# Patient Record
Sex: Male | Born: 1985 | Race: White | Hispanic: No | Marital: Married | Smoking: Never smoker
Health system: Southern US, Community
[De-identification: ages and names within clinical notes are randomized; demographics above are authoritative.]

## PROBLEM LIST (undated history)

## (undated) DIAGNOSIS — E291 Testicular hypofunction: Secondary | ICD-10-CM

## (undated) HISTORY — PX: TONSILLECTOMY: SUR1361

## (undated) HISTORY — DX: Testicular hypofunction: E29.1

---

## 2007-04-18 ENCOUNTER — Ambulatory Visit: Payer: Self-pay | Admitting: Internal Medicine

## 2009-09-15 IMAGING — CT CT NECK WITH CONTRAST
2 series · 10 of 14 positions shown, 12 images · non-contrast
Comparison: none

REASON FOR EXAM: right neck mass
COMMENTS:

[Series 2: soft tissue · axial · 0.53mm/px · z∈[+531,+765]mm · 8 of 102 slices shown, 10 images]
[im 12/102  soft-tissue]
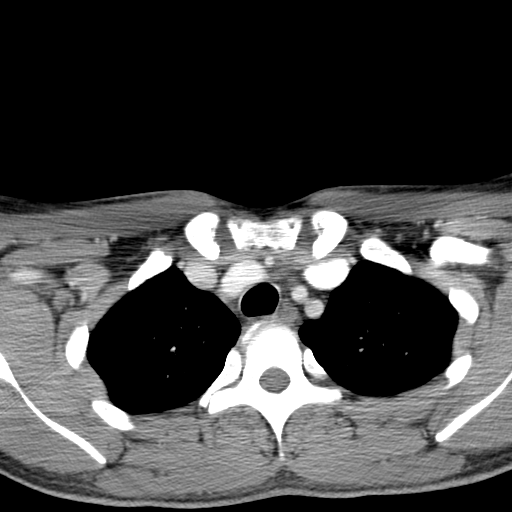
[im 12/102  bone]
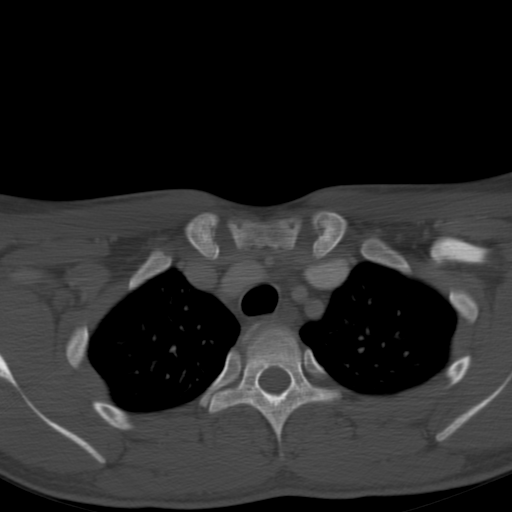
[im 23/102  bone]
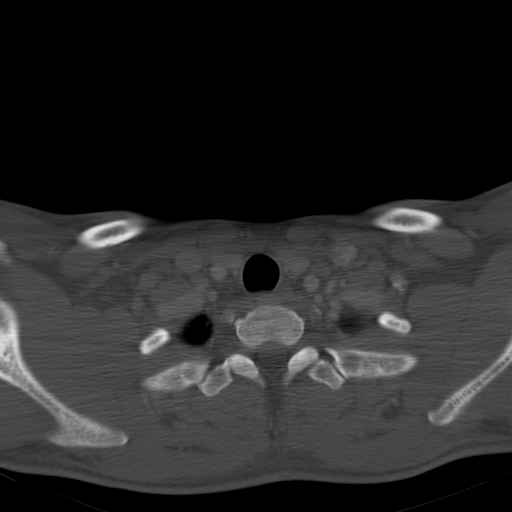
[im 34/102  bone]
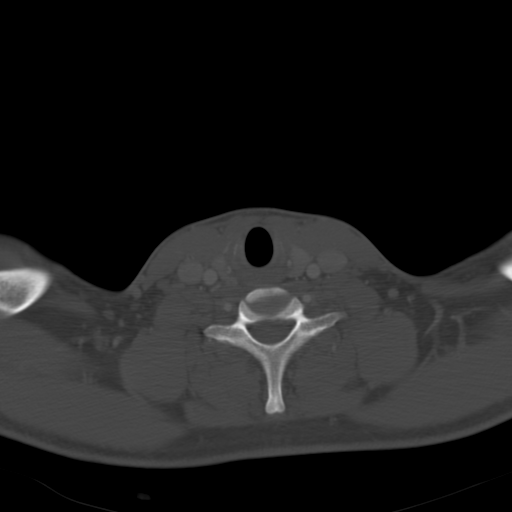
[im 45/102  bone]
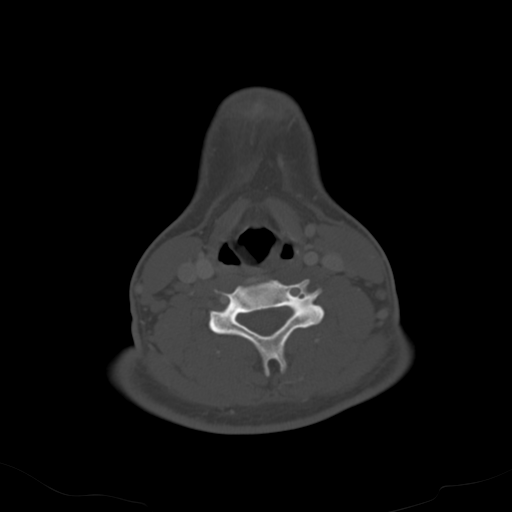
[im 57/102  soft-tissue]
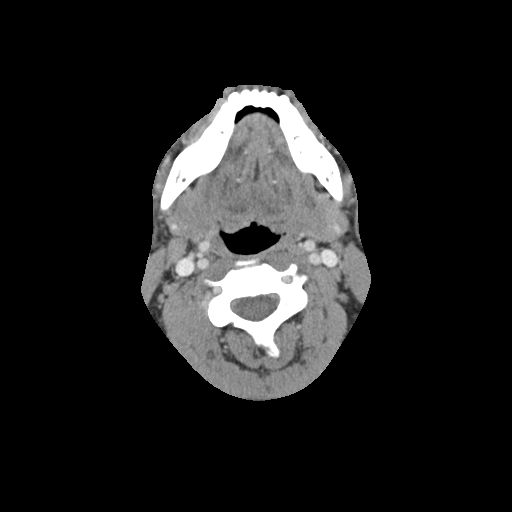
[im 57/102  bone]
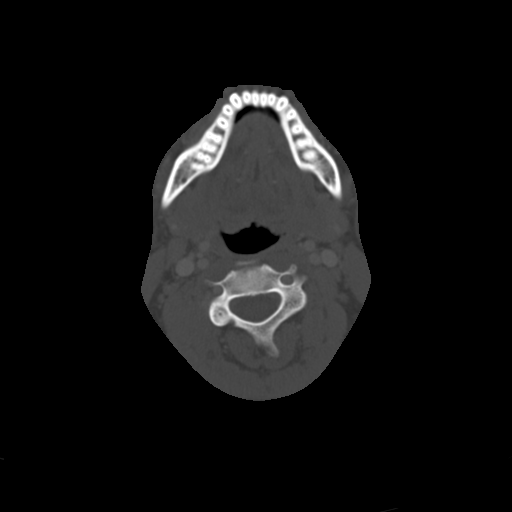
[im 68/102  bone]
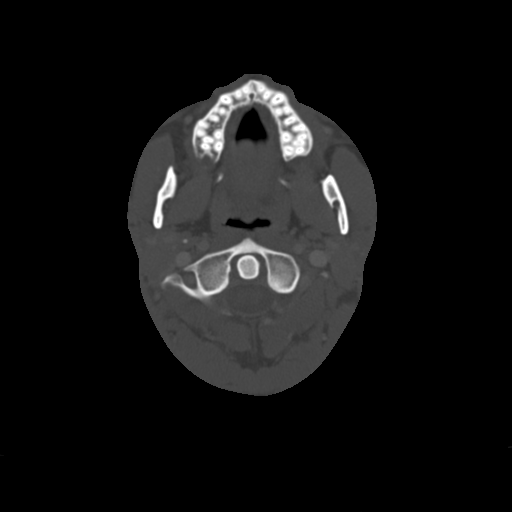
[im 79/102  bone]
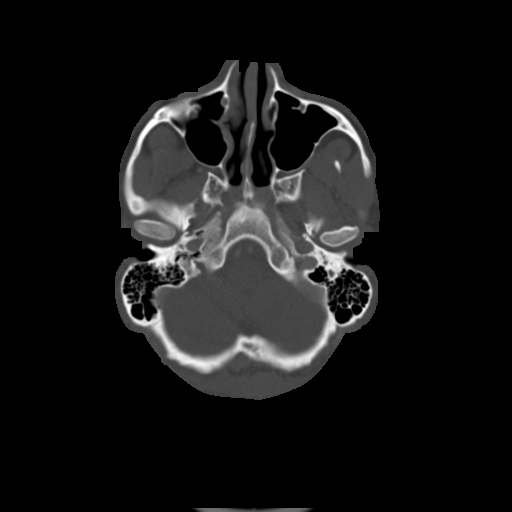
[im 90/102  bone]
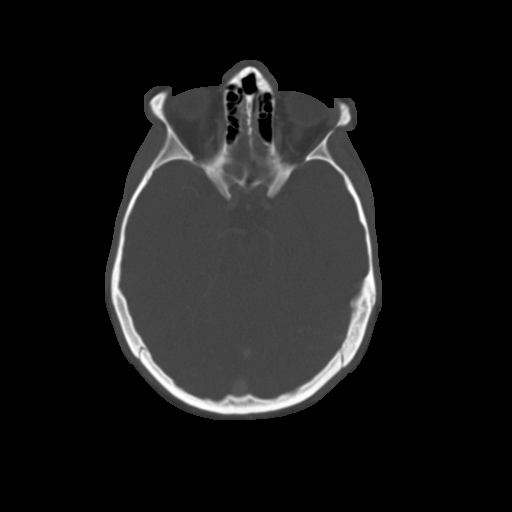

[Series 4: lung windows · axial · 0.54mm/px · z∈[+543,+591]mm · 2 of 48 slices shown]
[im 16/48  bone]
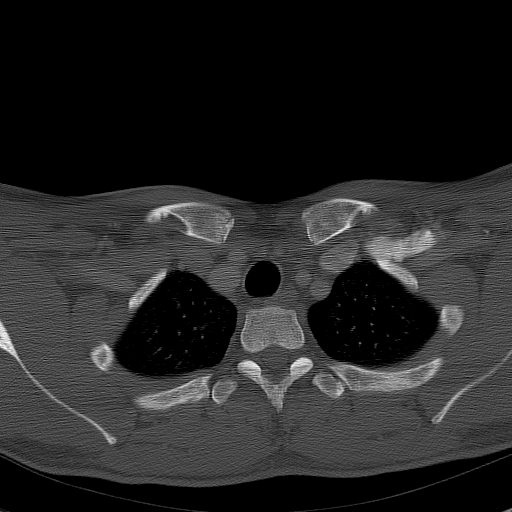
[im 32/48  bone]
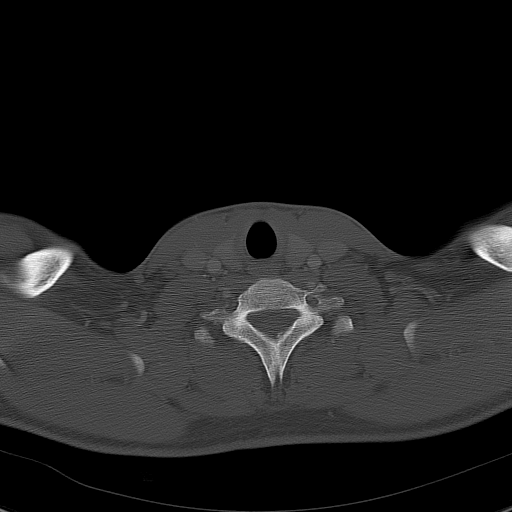

[10 of 14 positions shown; findings below may reference images not displayed]

PROCEDURE:     CT  - CT NECK WITH CONTRAST  - April 18, 2007  [DATE]

RESULT:     Helical 3 mm sections were obtained from the skull base through
the thoracic inlet.

Evaluation of the neck demonstrates no evidence of neck masses, free fluid
or loculated fluid collections. The opacified vascular structures are
unremarkable.  Shotty lymph nodes are identified within the anterior and
posterior cervical chains.  The largest projects anterior to the carotid
space on the RIGHT and measures approximately 1 cm in diameter.  The lung
parenchyma demonstrates no focal or gross abnormalities. Note; there does
not appear to be evidence of masses nor loculated fluid collections in the
region of palpable concern.
IMPRESSION: 1)Shotty adenopathy within the anterior and posterior cervical chains
without evidence of focal masses, free fluid, or loculated fluid
collections.

## 2010-01-26 HISTORY — PX: NASAL SEPTUM SURGERY: SHX37

## 2011-12-18 ENCOUNTER — Ambulatory Visit: Payer: Self-pay | Admitting: Unknown Physician Specialty

## 2013-06-20 ENCOUNTER — Emergency Department: Payer: Self-pay | Admitting: Emergency Medicine

## 2016-06-08 ENCOUNTER — Ambulatory Visit
Admission: RE | Admit: 2016-06-08 | Discharge: 2016-06-08 | Disposition: A | Discharge status: Home / Self Care | Payer: Worker's Compensation | Source: Ambulatory Visit | Attending: Family Medicine | Admitting: Family Medicine

## 2016-06-08 ENCOUNTER — Other Ambulatory Visit: Payer: Self-pay | Admitting: Family Medicine

## 2016-06-08 DIAGNOSIS — S8012XA Contusion of left lower leg, initial encounter: Secondary | ICD-10-CM | POA: Diagnosis not present

## 2016-06-08 DIAGNOSIS — R52 Pain, unspecified: Secondary | ICD-10-CM

## 2016-06-08 DIAGNOSIS — M79662 Pain in left lower leg: Secondary | ICD-10-CM | POA: Insufficient documentation

## 2016-06-08 DIAGNOSIS — W208XXA Other cause of strike by thrown, projected or falling object, initial encounter: Secondary | ICD-10-CM | POA: Diagnosis not present

## 2018-08-29 ENCOUNTER — Telehealth: Payer: Self-pay

## 2018-08-29 NOTE — Telephone Encounter (Signed)
Hagan called states son had fever last night of 101.6 and belly pain. No other sx. Pt has no sx at all. Spoke with MD states as long as pt has no sx and son hasnt had a concern for covid exposure or isnt being tested for covid he is ok to go to work. If any new sx or sons MD advises to isolate or be tested call back.

## 2018-10-18 DIAGNOSIS — H52203 Unspecified astigmatism, bilateral: Secondary | ICD-10-CM | POA: Diagnosis not present

## 2018-10-31 ENCOUNTER — Telehealth: Payer: Self-pay | Admitting: Internal Medicine

## 2018-10-31 ENCOUNTER — Other Ambulatory Visit: Payer: Self-pay

## 2018-10-31 DIAGNOSIS — Z20828 Contact with and (suspected) exposure to other viral communicable diseases: Secondary | ICD-10-CM

## 2018-10-31 DIAGNOSIS — J069 Acute upper respiratory infection, unspecified: Secondary | ICD-10-CM

## 2018-10-31 DIAGNOSIS — Z20822 Contact with and (suspected) exposure to covid-19: Secondary | ICD-10-CM

## 2018-10-31 MED ORDER — AMOXICILLIN-POT CLAVULANATE 875-125 MG PO TABS: 1.0000 | ORAL_TABLET | Freq: Two times a day (BID) | ORAL | 0 refills | Status: AC

## 2018-10-31 MED ORDER — FLUTICASONE PROPIONATE 50 MCG/ACT NA SUSP: 2.0000 | Freq: Every day | NASAL | 0 refills | Status: DC

## 2018-10-31 NOTE — Telephone Encounter (Signed)
Patient called [place] [facility].  Reports 8 day history of bilateral maxillary sinus pressure/congestion, post-nasal drip, chest congestion, and cough. Cough intermittently dry vs junky/productive. Symptoms not improving. Phlegm was clear; now thick yellow/green. Has not taken any OTC medication. Denies fever, chills, body aches, wheezing, SOB, nausea/vomiting, diarrhea. No known Covid19 exposure.  Diagnosed patient with URI; suspect sinusitis. Prescribed 1-week course of Augmentin 875/125mg  bid. Prescribed Flonase nasal spray. Advised increase fluids and using OTC Mucinex. Advised patient follow-up with clinic, PCP, or urgent care in 4-5 days if symptoms not improving - sooner with any worsening symptoms such as fever, chills, body aches, chest pain, SOB/dyspnea, or other new/concerning symptom.  Advised patient go to [facility] for community testing of [phone] (testing site closes at 3:30pm). Patient has been working with current symptoms; stated that employee should not work, at least until Covid19 test results come back.

## 2018-10-31 NOTE — Telephone Encounter (Signed)
Spoke with Rick Fletcher - states symptoms started last Monday (7 days). Has has some headaches, but doesn't have one today. Yesterday started having some facial pain/pressure in the sinus area. No discomfort to ears. No fever during this time. Coughs up more phlegm in the mornings, but continues to cough up some throughout the day. Phlegm - dk green to brownish yellow. No nasal drainage. Until today it's been mainly in my chest, today it's started moving to sinuses. Using OTC generic mucous relief product - takes twice a day. Took Claritin some last week, but hasn't taken any the past 3 days. States I feel fine - I just know I have some chest congestion & I don't want it to go into pneumonia. Hasn't gotten much relief with the mucous relief medication.  Claritin might have helped a little.  Please advise - talk with him by phone, bring in for evaluation, send for covid testing.  AMD

## 2018-10-31 NOTE — Telephone Encounter (Signed)
In the last 24 hours have you experienced any of these symptoms  Difficulty breathing no  Nasal congestion  yes  New cough  some  Runny nose  no  Shortness of breath  no  New sore throat  no  Unexplained body aches  no  Nausea or vomiting  no  Diarrhea  no  Loss of taste or smell  no  Fever (temp>100 F/37.8 C) or chills     Exposure:   Have you been in close contact with someone with confirmed diagnosis of COVID or person under going testing in past 14 days?  no   Have you been tested for COVID? If yes date, location, results in known  no   Have you been living in the same home as a person with confirmed diagnosis of COVID or a person undergoing testing? (household contact)  no   Have you been diagnosed with COVID or are you waiting on test results?  no   Have you traveled anywhere in the past 4 weeks? If yes where  No  Pt states sx stared last week. He believes his has an upper respiratory infection that is also in his sinuses. He is coughing up dark green/yellow mucus. He is taking mucus relieve meds.

## 2018-11-01 LAB — NOVEL CORONAVIRUS, NAA: SARS-CoV-2, NAA: NOT DETECTED

## 2018-11-02 ENCOUNTER — Telehealth: Payer: Self-pay

## 2018-11-02 NOTE — Telephone Encounter (Signed)
Patient called with COVID results.  Results negative.  Requested to have testosterone labs drawn for his urologist.  Appt for lab draw on 11/04/2018.

## 2018-11-04 ENCOUNTER — Other Ambulatory Visit: Payer: 59

## 2018-11-04 ENCOUNTER — Other Ambulatory Visit: Payer: Self-pay

## 2018-11-04 DIAGNOSIS — E291 Testicular hypofunction: Secondary | ICD-10-CM

## 2018-11-04 NOTE — Progress Notes (Signed)
Rick Fletcher presents with [facility] Requisition for Estradiol [phone]) & Total Testosterone [phone]) for Dr. Maryan Puls of [facility].  AMD

## 2018-11-05 LAB — TESTOSTERONE: Testosterone: 502 ng/dL (ref 264–916)

## 2018-11-05 LAB — ESTRADIOL: Estradiol: 25.7 pg/mL (ref 7.6–42.6)
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
## 2018-11-09 DIAGNOSIS — Z79899 Other long term (current) drug therapy: Secondary | ICD-10-CM | POA: Diagnosis not present

## 2018-11-09 DIAGNOSIS — E669 Obesity, unspecified: Secondary | ICD-10-CM | POA: Diagnosis not present

## 2018-11-09 DIAGNOSIS — E291 Testicular hypofunction: Secondary | ICD-10-CM | POA: Diagnosis not present

## 2019-02-23 ENCOUNTER — Other Ambulatory Visit: Payer: Self-pay

## 2019-02-23 ENCOUNTER — Ambulatory Visit: Payer: Self-pay

## 2019-02-23 DIAGNOSIS — N469 Male infertility, unspecified: Secondary | ICD-10-CM

## 2019-02-23 DIAGNOSIS — Z0000 Encounter for general adult medical examination without abnormal findings: Secondary | ICD-10-CM

## 2019-02-23 LAB — POCT URINALYSIS DIPSTICK
Bilirubin, UA: NEGATIVE
Blood, UA: NEGATIVE
Glucose, UA: NEGATIVE
Ketones, UA: NEGATIVE
Leukocytes, UA: NEGATIVE
Nitrite, UA: NEGATIVE
Protein, UA: NEGATIVE
Spec Grav, UA: 1.01 (ref 1.010–1.025)
Urobilinogen, UA: 0.2 E.U./dL
pH, UA: 7.5 (ref 5.0–8.0)

## 2019-02-23 NOTE — Addendum Note (Signed)
Addended by: Nida Boatman on: 02/23/2019 08:53 AM   Modules accepted: Orders

## 2019-02-23 NOTE — Progress Notes (Signed)
Patient is here today for pre physical labs. Patient is scheduled for a physical on 03/02/19 with Anette Riedel, PA-C.

## 2019-02-24 LAB — CMP12+LP+TP+TSH+6AC+CBC/D/PLT
ALT: 22 IU/L (ref 0–44)
AST: 27 IU/L (ref 0–40)
Albumin/Globulin Ratio: 2.1 (ref 1.2–2.2)
Albumin: 5 g/dL (ref 4.0–5.0)
Alkaline Phosphatase: 53 IU/L (ref 39–117)
BUN/Creatinine Ratio: 8 — ABNORMAL LOW (ref 9–20)
BUN: 9 mg/dL (ref 6–20)
Basophils Absolute: 0 10*3/uL (ref 0.0–0.2)
Basos: 0 %
Bilirubin Total: 0.5 mg/dL (ref 0.0–1.2)
Calcium: 9.3 mg/dL (ref 8.7–10.2)
Chloride: 101 mmol/L (ref 96–106)
Chol/HDL Ratio: 3.6 ratio (ref 0.0–5.0)
Cholesterol, Total: 160 mg/dL (ref 100–199)
Creatinine, Ser: 1.06 mg/dL (ref 0.76–1.27)
EOS (ABSOLUTE): 0 10*3/uL (ref 0.0–0.4)
Eos: 1 %
Estimated CHD Risk: 0.6 times avg. (ref 0.0–1.0)
Free Thyroxine Index: 2.5 (ref 1.2–4.9)
GFR calc Af Amer: 108 mL/min/{1.73_m2} (ref >59)
GFR calc non Af Amer: 93 mL/min/{1.73_m2} (ref >59)
GGT: 14 IU/L (ref 0–65)
Globulin, Total: 2.4 g/dL (ref 1.5–4.5)
Glucose: 91 mg/dL (ref 65–99)
HDL: 45 mg/dL (ref >39)
Hematocrit: 46.5 % (ref 37.5–51.0)
Hemoglobin: 15.9 g/dL (ref 13.0–17.7)
Immature Grans (Abs): 0 10*3/uL (ref 0.0–0.1)
Immature Granulocytes: 0 %
Iron: 96 ug/dL (ref 38–169)
LDH: 226 IU/L — ABNORMAL HIGH (ref 121–224)
LDL Chol Calc (NIH): 99 mg/dL (ref 0–99)
Lymphocytes Absolute: 1.5 10*3/uL (ref 0.7–3.1)
Lymphs: 31 %
MCH: 29 pg (ref 26.6–33.0)
MCHC: 34.2 g/dL (ref 31.5–35.7)
MCV: 85 fL (ref 79–97)
Monocytes Absolute: 0.5 10*3/uL (ref 0.1–0.9)
Monocytes: 10 %
Neutrophils Absolute: 2.7 10*3/uL (ref 1.4–7.0)
Neutrophils: 58 %
Phosphorus: 3 mg/dL (ref 2.8–4.1)
Platelets: 271 10*3/uL (ref 150–450)
Potassium: 4.5 mmol/L (ref 3.5–5.2)
RBC: 5.49 x10E6/uL (ref 4.14–5.80)
RDW: 12.8 % (ref 11.6–15.4)
Sodium: 140 mmol/L (ref 134–144)
T3 Uptake Ratio: 27 % (ref 24–39)
T4, Total: 9.2 ug/dL (ref 4.5–12.0)
TSH: 1.6 u[IU]/mL (ref 0.450–4.500)
Total Protein: 7.4 g/dL (ref 6.0–8.5)
Triglycerides: 86 mg/dL (ref 0–149)
Uric Acid: 6.8 mg/dL (ref 3.8–8.4)
VLDL Cholesterol Cal: 16 mg/dL (ref 5–40)
WBC: 4.7 10*3/uL (ref 3.4–10.8)

## 2019-02-27 ENCOUNTER — Other Ambulatory Visit: Payer: Self-pay

## 2019-02-27 ENCOUNTER — Ambulatory Visit: Payer: Self-pay | Admitting: Physician Assistant

## 2019-02-27 VITALS — BP 126/72 | HR 78 | Temp 99.4°F | Ht 71.0 in | Wt 236.4 lb

## 2019-02-27 DIAGNOSIS — N469 Male infertility, unspecified: Secondary | ICD-10-CM

## 2019-02-27 DIAGNOSIS — Z0000 Encounter for general adult medical examination without abnormal findings: Secondary | ICD-10-CM

## 2019-02-27 NOTE — Progress Notes (Signed)
   Subjective: Annual physical    Patient ID: Rick Fletcher, male    DOB: May 10, 1985, 34 y.o.   MRN: [id number]  HPI Patient reports today for annual physical.  No concerns or complaints.   Review of Systems Infertility    Objective:   Physical Exam No acute distress.  HEENT unremarkable.  Neck is supple without adenopathy or bruits.  Lungs clear to auscultation.  Heart regular rhythm..  Abdomen with negative HSM.  Normoactive bowel sounds.  Soft nontender palpation.  No obvious deformity to the cervical lumbar spine.  Patient has full equal range of motion cervical lumbar spine.  No deformity to the upper or lower extremities.  Patient has full equal range of motion of the upper and lower extremities.  Cranial nerves II through XII are grossly intact.       Assessment & Plan: Well exam.  Discussed lab results with patient.  Patient still pending Testosterone and prolactin levels.  Continue previous medications.  Follow-up as needed.

## 2019-03-01 NOTE — Addendum Note (Signed)
Addended by: Nida Boatman on: 03/01/2019 11:09 AM   Modules accepted: Orders

## 2019-08-12 DIAGNOSIS — Z20822 Contact with and (suspected) exposure to covid-19: Secondary | ICD-10-CM | POA: Diagnosis not present

## 2019-10-12 ENCOUNTER — Other Ambulatory Visit: Payer: Self-pay
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
## 2020-02-07 ENCOUNTER — Ambulatory Visit: Payer: Self-pay

## 2020-02-07 ENCOUNTER — Other Ambulatory Visit: Payer: Self-pay

## 2020-02-07 DIAGNOSIS — Z0000 Encounter for general adult medical examination without abnormal findings: Secondary | ICD-10-CM

## 2020-02-07 LAB — POCT URINALYSIS DIPSTICK
Bilirubin, UA: NEGATIVE
Blood, UA: NEGATIVE
Glucose, UA: NEGATIVE
Ketones, UA: NEGATIVE
Leukocytes, UA: NEGATIVE
Nitrite, UA: NEGATIVE
Protein, UA: NEGATIVE
Spec Grav, UA: 1.01 (ref 1.010–1.025)
Urobilinogen, UA: 0.2 E.U./dL
pH, UA: 7 (ref 5.0–8.0)

## 2020-02-07 NOTE — Progress Notes (Signed)
Pt scheduled to complete physical 02/14/20 with Ron [employer]

## 2020-02-08 LAB — CMP12+LP+TP+TSH+6AC+CBC/D/PLT
ALT: 23 IU/L (ref 0–44)
AST: 21 IU/L (ref 0–40)
Albumin/Globulin Ratio: 2.2 (ref 1.2–2.2)
Albumin: 5.2 g/dL — ABNORMAL HIGH (ref 4.0–5.0)
Alkaline Phosphatase: 68 IU/L (ref 44–121)
BUN/Creatinine Ratio: 13 (ref 9–20)
BUN: 14 mg/dL (ref 6–20)
Basophils Absolute: 0 10*3/uL (ref 0.0–0.2)
Basos: 0 %
Bilirubin Total: 0.4 mg/dL (ref 0.0–1.2)
Calcium: 9.6 mg/dL (ref 8.7–10.2)
Chloride: 98 mmol/L (ref 96–106)
Chol/HDL Ratio: 3.7 ratio (ref 0.0–5.0)
Cholesterol, Total: 182 mg/dL (ref 100–199)
Creatinine, Ser: 1.04 mg/dL (ref 0.76–1.27)
EOS (ABSOLUTE): 0.1 10*3/uL (ref 0.0–0.4)
Eos: 2 %
Estimated CHD Risk: 0.6 times avg. (ref 0.0–1.0)
Free Thyroxine Index: 2.1 (ref 1.2–4.9)
GFR calc Af Amer: 109 mL/min/{1.73_m2} (ref >59)
GFR calc non Af Amer: 95 mL/min/{1.73_m2} (ref >59)
GGT: 24 IU/L (ref 0–65)
Globulin, Total: 2.4 g/dL (ref 1.5–4.5)
Glucose: 88 mg/dL (ref 65–99)
HDL: 49 mg/dL (ref >39)
Hematocrit: 43.6 % (ref 37.5–51.0)
Hemoglobin: 15.8 g/dL (ref 13.0–17.7)
Immature Grans (Abs): 0 10*3/uL (ref 0.0–0.1)
Immature Granulocytes: 0 %
Iron: 65 ug/dL (ref 38–169)
LDH: 171 IU/L (ref 121–224)
LDL Chol Calc (NIH): 115 mg/dL — ABNORMAL HIGH (ref 0–99)
Lymphocytes Absolute: 1.5 10*3/uL (ref 0.7–3.1)
Lymphs: 32 %
MCH: 30.8 pg (ref 26.6–33.0)
MCHC: 36.2 g/dL — ABNORMAL HIGH (ref 31.5–35.7)
MCV: 85 fL (ref 79–97)
Monocytes Absolute: 0.5 10*3/uL (ref 0.1–0.9)
Monocytes: 11 %
Neutrophils Absolute: 2.6 10*3/uL (ref 1.4–7.0)
Neutrophils: 55 %
Phosphorus: 3.1 mg/dL (ref 2.8–4.1)
Platelets: 236 10*3/uL (ref 150–450)
Potassium: 4.3 mmol/L (ref 3.5–5.2)
RBC: 5.13 x10E6/uL (ref 4.14–5.80)
RDW: 12.7 % (ref 11.6–15.4)
Sodium: 139 mmol/L (ref 134–144)
T3 Uptake Ratio: 24 % (ref 24–39)
T4, Total: 8.7 ug/dL (ref 4.5–12.0)
TSH: 2.49 u[IU]/mL (ref 0.450–4.500)
Total Protein: 7.6 g/dL (ref 6.0–8.5)
Triglycerides: 96 mg/dL (ref 0–149)
Uric Acid: 7.2 mg/dL (ref 3.8–8.4)
VLDL Cholesterol Cal: 18 mg/dL (ref 5–40)
WBC: 4.7 10*3/uL (ref 3.4–10.8)

## 2020-02-09 LAB — TESTOSTERONE,FREE AND TOTAL
Testosterone, Free: 16.6 pg/mL (ref 8.7–25.1)
Testosterone: 687 ng/dL (ref 264–916)

## 2020-02-20 ENCOUNTER — Encounter: Payer: Self-pay | Admitting: Adult Medicine

## 2020-02-20 ENCOUNTER — Ambulatory Visit: Payer: Self-pay | Admitting: Adult Medicine

## 2020-02-20 ENCOUNTER — Other Ambulatory Visit: Payer: Self-pay

## 2020-02-20 VITALS — BP 134/83 | HR 72 | Temp 98.1°F | Ht 71.5 in | Wt 237.0 lb

## 2020-02-20 DIAGNOSIS — Z0000 Encounter for general adult medical examination without abnormal findings: Secondary | ICD-10-CM

## 2020-02-20 NOTE — Progress Notes (Signed)
Routine I have reviewed the triage vital signs and the nursing notes.   HISTORY Chief Complaint Annual Exam   HPI Rick Fletcher is a 35 y.o. male  Feeling well denies current health concern other then occassional fatigue    Past Medical History:  Diagnosis Date  . Hypogonadism male      Past Surgical History:  Procedure Laterality Date  . NASAL SEPTUM SURGERY  2012   Dr. Jenne Campus  . TONSILLECTOMY      Prior to Admission medications   Medication Sig Start Date End Date Taking? Authorizing Provider  clomiPHENE (CLOMID) 50 MG tablet Take 50 mg by mouth daily. 02/24/19  Yes [provider]  Multiple Vitamins-Minerals (MULTIVITAMIN ADULTS PO) Take 1 tablet by mouth daily.   Yes [provider]  fluticasone (FLONASE) 50 MCG/ACT nasal spray Place 2 sprays into both nostrils daily. Patient not taking: No sig reported 10/31/18   Janalyn Harder, PA-C    Allergies Patient has no known allergies.  History reviewed. No pertinent family history.  Social History Social History   Tobacco Use  . Smoking status: Never Smoker  . Smokeless tobacco: Never Used    Review of Systems Reports occ fatigue Constitutional: No fever/chills Eyes: No visual changes. ENT: No sore throat. Cardiovascular: Denies chest pain. Respiratory: Denies shortness of breath. Gastrointestinal: No abdominal pain.  No nausea, no vomiting.  No diarrhea.  No constipation. Genitourinary: Negative for dysuria. Musculoskeletal: Negative for back pain. Skin: Negative for rash. Neurological: Negative for headaches, focal weakness or numbness.    ___________________________________  PHYSICAL EXAM:  VITAL SIGNS: Constitutional: Alert and oriented. Well appearing and in no acute distress. Eyes: Conjunctivae are normal. PERRL. EOMI. Head: Atraumatic. Nose: No congestion/rhinnorhea. Mouth/Throat: Mucous membranes are moist.  Oropharynx non-erythematous. Neck: No stridor No cervical  spine tenderness to palpation Hematological/Lymphatic/Immunilogical: No cervical lymphadenopathy.thryoid non palpable Cardiovascular: Normal rate, regular rhythm. Grossly normal heart sounds.  Good peripheral circulation. Respiratory: Normal respiratory effort.  No retractions. Lungs CTAB. Gastrointestinal: Soft and nontender. No distention. No abdominal bruits. No CVA tenderness. Genitourinary:  Nl size 2 scrotal testes, no hernia Musculoskeletal: No lower extremity tenderness nor edema.  No joint effusions. Neurologic:  Normal speech and language. No gross focal neurologic deficits are appreciated. No gait instability. Skin:  Skin is warm, dry and intact. No rash noted. Psychiatric: Mood and affect are normal. Speech and behavior are normal.  ___________________________________   LABS Glucose 65 - 99 mg/dL 88  91   Uric Acid 3.8 - 8.4 mg/dL 7.2  6.8 CM   Comment:      Therapeutic target for gout patients: <6.0  BUN 6 - 20 mg/dL 14  9   Creatinine, Ser 0.76 - 1.27 mg/dL 4.08  1.44   GFR calc non Af Amer >59 mL/min/1.73 95  93    Total Protein 6.0 - 8.5 g/dL 7.6  7.4   Albumin 4.0 - 5.0 g/dL 8.1EHUD  5.0   Globulin, Total 1.5 - 4.5 g/dL 2.4  2.4    Cholesterol, Total 100 - 199 mg/dL 149  702   Triglycerides 0 - 149 mg/dL 96  86   HDL >63 mg/dL 49  45   VLDL Cholesterol Cal 5 - 40 mg/dL 18  16   LDL Chol Calc (NIH) 0 - 99 mg/dL [id number]  99    TSH 2.774 - 4.500 uIU/mL 2.490  1.600   T4, Total 4.5 - 12.0 ug/dL 8.7  9.2   T3 Uptake Ratio 24 -  39 % 24  27   Free Thyroxine Index 1.2 - 4.9 2.1  2.5   Component Ref Range & Units 13 d ago 1 yr ago  Testosterone 264 - 916 ng/dL 408  144 CM   Comment: Adult male reference interval is based on a population of  healthy nonobese males (BMI <30) between 58 and 78 years old.  Travison, et.al. JCEM [phone]. PMID: [id number].   Testosterone, Free 8.7 - 25.1 pg/mL 16.6       INITIAL IMPRESSION / ASSESSMENT    34y well  annual exam visit in exam lab and mental status. Client is currently taking clomid, d/c testosterone im under urology [facility] care.  Client request information on BMI, BF relation to endo tests was advise use beta alanine, bcaa may be assistive with prudent core training exercises

## 2020-02-20 NOTE — Progress Notes (Signed)
Urologist - Dr. Anola Gurney

## 2020-04-04 ENCOUNTER — Other Ambulatory Visit: Payer: Self-pay

## 2020-04-04 DIAGNOSIS — Z79899 Other long term (current) drug therapy: Secondary | ICD-10-CM

## 2020-04-04 DIAGNOSIS — N4611 Organic oligospermia: Secondary | ICD-10-CM

## 2020-04-04 DIAGNOSIS — N469 Male infertility, unspecified: Secondary | ICD-10-CM

## 2020-04-05 LAB — ESTRADIOL: Estradiol: 21 pg/mL (ref 7.6–42.6)

## 2020-04-05 LAB — PROLACTIN: Prolactin: 5.2 ng/mL (ref 4.0–15.2)

## 2020-04-05 LAB — FSH/LH
FSH: 2.3 m[IU]/mL (ref 1.5–12.4)
LH: 4.2 m[IU]/mL (ref 1.7–8.6)

## 2020-04-05 LAB — TESTOSTERONE: Testosterone: 497 ng/dL (ref 264–916)

## 2020-04-08 DIAGNOSIS — Z79899 Other long term (current) drug therapy: Secondary | ICD-10-CM | POA: Diagnosis not present

## 2020-04-08 DIAGNOSIS — N4601 Organic azoospermia: Secondary | ICD-10-CM | POA: Diagnosis not present

## 2020-04-08 DIAGNOSIS — E291 Testicular hypofunction: Secondary | ICD-10-CM | POA: Diagnosis not present

## 2020-09-10 ENCOUNTER — Other Ambulatory Visit: Payer: Self-pay

## 2020-09-10 ENCOUNTER — Ambulatory Visit: Payer: Self-pay

## 2020-09-10 VITALS — BP 115/73 | HR 76

## 2020-09-10 DIAGNOSIS — Z0130 Encounter for examination of blood pressure without abnormal findings: Secondary | ICD-10-CM

## 2020-09-10 NOTE — Progress Notes (Signed)
Pt presents today for BP check. 

## 2020-10-22 ENCOUNTER — Encounter: Payer: Self-pay | Admitting: Physician Assistant

## 2020-10-22 ENCOUNTER — Ambulatory Visit: Payer: Self-pay | Admitting: Physician Assistant

## 2020-10-22 ENCOUNTER — Other Ambulatory Visit: Payer: Self-pay

## 2020-10-22 VITALS — BP 122/82 | HR 75 | Temp 98.4°F | Resp 14 | Ht 71.0 in | Wt 220.5 lb

## 2020-10-22 DIAGNOSIS — S39012A Strain of muscle, fascia and tendon of lower back, initial encounter: Secondary | ICD-10-CM

## 2020-10-22 MED ORDER — NAPROXEN 500 MG PO TABS: 500.0000 mg | ORAL_TABLET | Freq: Two times a day (BID) | ORAL | 0 refills | Status: DC

## 2020-10-22 MED ORDER — ORPHENADRINE CITRATE ER 100 MG PO TB12: 100.0000 mg | ORAL_TABLET | Freq: Two times a day (BID) | ORAL | 0 refills | Status: DC

## 2020-10-22 NOTE — Progress Notes (Signed)
Pt has been experiencing lower back painx3 days gradually getting worse, the pain radiates across waist line and can feel it at the top of buttocks. Gretel Acre

## 2020-10-22 NOTE — Progress Notes (Signed)
   Subjective: Back pain    Patient ID: Rick Fletcher, male    DOB: [birth year], 35 y.o.   MRN: [id number]  HPI Patient presents with 3 days of low back pain.  Patient stated no specific provocative incident for complaint.  Job requires repetitive bending and lifting.  Patient denies radicular component to back pain.  Patient denies bladder or bowel dysfunction.  Patient rates the pain as a 5/10.  Described pain as "achy".  No palliative measure for complaint.  Patient states periodic episodes of this complaint 3-4 times a year.   Review of Systems Negative except for complaint    Objective:   Physical Exam  No acute distress.  Temperature 98.4, pulse 75, respiration 14, BP is 122/82, and patient 98% O2 sat on room air.  Patient weighs 220 pounds and BMI is 30.75. No obvious lumbar spine deformity.  Patient has free and equal range of motion of the lumbar spine.  Patient has right paraspinal muscle spasm with lateral movements and extension.  Patient has negative straight leg test in supine position.      Assessment & Plan: Lumbar strain.   Patient advised to wear elastic lumbar support when working.  Patient given prescription for Norflex and naproxen to take twice daily for 7 to 10 days.  Return to clinic if no improvement in 1 week.

## 2020-11-05 DIAGNOSIS — H52223 Regular astigmatism, bilateral: Secondary | ICD-10-CM | POA: Diagnosis not present

## 2020-11-11 ENCOUNTER — Ambulatory Visit: Payer: Self-pay | Admitting: Physician Assistant

## 2020-11-11 ENCOUNTER — Other Ambulatory Visit: Payer: Self-pay

## 2020-11-11 ENCOUNTER — Encounter: Payer: Self-pay | Admitting: Physician Assistant

## 2020-11-11 VITALS — BP 127/79 | HR 85 | Temp 98.4°F | Resp 14 | Ht 71.0 in | Wt 222.0 lb

## 2020-11-11 DIAGNOSIS — B353 Tinea pedis: Secondary | ICD-10-CM

## 2020-11-11 MED ORDER — ALUMINUM CHLORIDE 20 % EX SOLN: Freq: Every day | CUTANEOUS | 0 refills | Status: DC

## 2020-11-11 MED ORDER — CLOTRIMAZOLE-BETAMETHASONE 1-0.05 % EX CREA: 1.0000 "application " | TOPICAL_CREAM | Freq: Every day | CUTANEOUS | 0 refills | Status: DC

## 2020-11-11 NOTE — Progress Notes (Signed)
Both feet between toews for the past two weeks has had this bluish color to them and right big toe has some numbing to it./CL,RMA

## 2020-11-11 NOTE — Progress Notes (Signed)
   Subjective: Tinea pedis    Patient ID: Rick Fletcher, male    DOB: [birth year], 35 y.o.   MRN: [id number]  HPI  Patient presents with peeling and cracking between the fourth and fifth digit bilateral foot for 2 weeks.  Patient state wearing boots and has increased sweating throughout the day.  Review of Systems Negative septal complaint    Objective:   Physical Exam No acute distress.  Temperature 98.4, pulse 85, respiration 14, BP is 127/79, and patient 96% O2 sat on room air. Exam needed feet reveal bilateral macerated skin between the fourth and fifth digit.       Assessment & Plan: Tinea pedis  Discussed treatment plan consisting of Drysol antifungal cream.  Follow-up 1 week if no improvement.

## 2020-11-13 ENCOUNTER — Ambulatory Visit: Payer: 59 | Admitting: Physician Assistant

## 2020-11-27 ENCOUNTER — Encounter: Payer: Self-pay | Admitting: Physician Assistant

## 2020-11-27 ENCOUNTER — Ambulatory Visit: Payer: Self-pay | Admitting: Physician Assistant

## 2020-11-27 ENCOUNTER — Other Ambulatory Visit: Payer: Self-pay

## 2020-11-27 VITALS — Temp 98.2°F | Resp 14 | Ht 71.0 in | Wt 225.0 lb

## 2020-11-27 DIAGNOSIS — H109 Unspecified conjunctivitis: Secondary | ICD-10-CM

## 2020-11-27 MED ORDER — ORPHENADRINE CITRATE ER 100 MG PO TB12: 100.0000 mg | ORAL_TABLET | Freq: Two times a day (BID) | ORAL | 0 refills | Status: DC

## 2020-11-27 MED ORDER — NAPROXEN 500 MG PO TABS: 500.0000 mg | ORAL_TABLET | Freq: Two times a day (BID) | ORAL | 0 refills | Status: DC

## 2020-11-27 NOTE — Progress Notes (Signed)
Pt presents today for pink eye that started last night. Left eye/CL,RMA

## 2020-11-27 NOTE — Progress Notes (Signed)
   Subjective: Left eye drainage    Patient ID: Rick Fletcher, male    DOB: [birth year], 35 y.o.   MRN: [id number]  HPI Patient presents with matted left eyelid is secondary to drainage.  Patient states been exposed to conjunctivitis from his children.  Patient complaint started last night.  Children complaint has resolved.  Patient also requested refill naproxen for recurrent lumbar strain.  Patient denies radicular component to back pain.  Patient denies bladder or bowel dysfunction.   Review of Systems Negative except for complaint      Objective:   Physical Exam No acute distress.  HEENT is remarkable for erythematous left conjunctiva.  Dry secretions from the left eye.  Back exam was deferred.       Assessment & Plan: Bacterial conjunctivitis   Discussed sequela of conjunctivitis with patient.  Advised use eyedrops as directed and treat both eyes.  Return back if no improvement or worsening of complaint.

## 2021-01-13 ENCOUNTER — Ambulatory Visit: Payer: 59

## 2021-01-13 ENCOUNTER — Other Ambulatory Visit: Payer: Self-pay

## 2021-01-13 DIAGNOSIS — Z0000 Encounter for general adult medical examination without abnormal findings: Secondary | ICD-10-CM

## 2021-01-13 LAB — POCT URINALYSIS DIPSTICK
Bilirubin, UA: NEGATIVE
Blood, UA: NEGATIVE
Glucose, UA: NEGATIVE
Ketones, UA: NEGATIVE
Leukocytes, UA: NEGATIVE
Nitrite, UA: NEGATIVE
Protein, UA: NEGATIVE
Spec Grav, UA: 1.01 (ref 1.010–1.025)
Urobilinogen, UA: 0.2 E.U./dL
pH, UA: 7 (ref 5.0–8.0)

## 2021-01-13 NOTE — Progress Notes (Signed)
Pt presents today for hearing screening for annual physical by Billey Gosling

## 2021-01-13 NOTE — Progress Notes (Signed)
01/28/21 annual physical scheduled.Rick Fletcher

## 2021-01-15 LAB — CMP12+LP+TP+TSH+6AC+PSA+CBC…
ALT: 37 IU/L (ref 0–44)
AST: 27 IU/L (ref 0–40)
Albumin/Globulin Ratio: 1.7 (ref 1.2–2.2)
Albumin: 4.9 g/dL (ref 4.0–5.0)
Alkaline Phosphatase: 88 IU/L (ref 44–121)
BUN/Creatinine Ratio: 16 (ref 9–20)
BUN: 16 mg/dL (ref 6–20)
Basophils Absolute: 0 10*3/uL (ref 0.0–0.2)
Basos: 0 %
Bilirubin Total: 0.5 mg/dL (ref 0.0–1.2)
Calcium: 9.5 mg/dL (ref 8.7–10.2)
Chloride: 98 mmol/L (ref 96–106)
Chol/HDL Ratio: 4 ratio (ref 0.0–5.0)
Cholesterol, Total: 193 mg/dL (ref 100–199)
Creatinine, Ser: 1 mg/dL (ref 0.76–1.27)
EOS (ABSOLUTE): 0.1 10*3/uL (ref 0.0–0.4)
Eos: 1 %
Estimated CHD Risk: 0.7 times avg. (ref 0.0–1.0)
Free Thyroxine Index: 2 (ref 1.2–4.9)
GGT: 16 IU/L (ref 0–65)
Globulin, Total: 2.9 g/dL (ref 1.5–4.5)
Glucose: 94 mg/dL (ref 70–99)
HDL: 48 mg/dL (ref >39)
Hematocrit: 47 % (ref 37.5–51.0)
Hemoglobin: 16.5 g/dL (ref 13.0–17.7)
Immature Grans (Abs): 0 10*3/uL (ref 0.0–0.1)
Immature Granulocytes: 0 %
Iron: 78 ug/dL (ref 38–169)
LDH: 156 IU/L (ref 121–224)
LDL Chol Calc (NIH): 130 mg/dL — ABNORMAL HIGH (ref 0–99)
Lymphocytes Absolute: 1.9 10*3/uL (ref 0.7–3.1)
Lymphs: 37 %
MCH: 28.9 pg (ref 26.6–33.0)
MCHC: 35.1 g/dL (ref 31.5–35.7)
MCV: 82 fL (ref 79–97)
Monocytes Absolute: 0.5 10*3/uL (ref 0.1–0.9)
Monocytes: 10 %
Neutrophils Absolute: 2.6 10*3/uL (ref 1.4–7.0)
Neutrophils: 52 %
Phosphorus: 3.7 mg/dL (ref 2.8–4.1)
Platelets: 317 10*3/uL (ref 150–450)
Potassium: 4.1 mmol/L (ref 3.5–5.2)
Prostate Specific Ag, Serum: 0.5 ng/mL (ref 0.0–4.0)
RBC: 5.71 x10E6/uL (ref 4.14–5.80)
RDW: 11.7 % (ref 11.6–15.4)
Sodium: 138 mmol/L (ref 134–144)
T3 Uptake Ratio: 27 % (ref 24–39)
T4, Total: 7.4 ug/dL (ref 4.5–12.0)
TSH: 2.06 u[IU]/mL (ref 0.450–4.500)
Total Protein: 7.8 g/dL (ref 6.0–8.5)
Triglycerides: 81 mg/dL (ref 0–149)
Uric Acid: 7.3 mg/dL (ref 3.8–8.4)
VLDL Cholesterol Cal: 15 mg/dL (ref 5–40)
WBC: 5.1 10*3/uL (ref 3.4–10.8)
eGFR: 102 mL/min/{1.73_m2} (ref >59)

## 2021-01-15 LAB — TESTOSTERONE,FREE AND TOTAL
Testosterone, Free: 6.9 pg/mL — ABNORMAL LOW (ref 8.7–25.1)
Testosterone: 266 ng/dL (ref 264–916)

## 2021-01-28 ENCOUNTER — Encounter: Payer: Self-pay | Admitting: Physician Assistant

## 2021-01-28 ENCOUNTER — Other Ambulatory Visit: Payer: Self-pay

## 2021-01-28 ENCOUNTER — Ambulatory Visit: Payer: Self-pay | Admitting: Physician Assistant

## 2021-01-28 VITALS — BP 142/94 | Temp 98.2°F | Resp 14 | Ht 71.0 in | Wt 226.0 lb

## 2021-01-28 DIAGNOSIS — S39012A Strain of muscle, fascia and tendon of lower back, initial encounter: Secondary | ICD-10-CM

## 2021-01-28 DIAGNOSIS — N5201 Erectile dysfunction due to arterial insufficiency: Secondary | ICD-10-CM | POA: Diagnosis not present

## 2021-01-28 DIAGNOSIS — Z79899 Other long term (current) drug therapy: Secondary | ICD-10-CM | POA: Diagnosis not present

## 2021-01-28 DIAGNOSIS — Z0000 Encounter for general adult medical examination without abnormal findings: Secondary | ICD-10-CM

## 2021-01-28 DIAGNOSIS — E291 Testicular hypofunction: Secondary | ICD-10-CM | POA: Diagnosis not present

## 2021-01-28 MED ORDER — ORPHENADRINE CITRATE ER 100 MG PO TB12: 100.0000 mg | ORAL_TABLET | Freq: Two times a day (BID) | ORAL | 0 refills | Status: DC

## 2021-01-28 NOTE — Progress Notes (Signed)
[place]  ____________________________________________   None    (approximate)  I have reviewed the triage vital signs and the nursing notes.   HISTORY  Chief Complaint Annual physical exam   HPI Rick Fletcher is a 36 y.o. male patient presents for annual physical exam.  Patient voices no concerns or complaints.  Requests refill of Norflex to use as needed for muscle spasms lumbar spine.         Past Medical History:  Diagnosis Date   Hypogonadism male     There are no problems to display for this patient.   Past Surgical History:  Procedure Laterality Date   NASAL SEPTUM SURGERY  2012   Dr. [name]   TONSILLECTOMY      Prior to Admission medications   Medication Sig Start Date End Date Taking? Authorizing Provider  aluminum chloride (DRYSOL) 20 % external solution Apply topically at bedtime. 11/11/20  Yes Sable Feil, PA-C  clotrimazole-betamethasone (LOTRISONE) cream Apply 1 application topically daily. 11/11/20  Yes Sable Feil, PA-C  Multiple Vitamins-Minerals (MULTIVITAMIN ADULTS PO) Take 1 tablet by mouth daily.   Yes [provider]  naproxen (NAPROSYN) 500 MG tablet Take 1 tablet (500 mg total) by mouth 2 (two) times daily with a meal. 11/27/20  Yes Sable Feil, PA-C  orphenadrine (NORFLEX) 100 MG tablet Take 1 tablet (100 mg total) by mouth 2 (two) times daily. 01/28/21   Sable Feil, PA-C  XYOSTED 75 MG/0.5ML SOAJ SMARTSIG:1 Pre-Filled Pen Syringe SUB-Q Once a Week 10/11/20   [provider]    Allergies Patient has no known allergies.  History reviewed. No pertinent family history.  Social History Social History   Tobacco Use   Smoking status: Never   Smokeless tobacco: Never    Review of Systems Constitutional: No fever/chills Eyes: No visual changes. ENT: No sore throat. Cardiovascular: Denies chest pain. Respiratory: Denies shortness of breath. Gastrointestinal: No abdominal pain.  No nausea,  no vomiting.  No diarrhea.  No constipation. Genitourinary: Negative for dysuria.  Hypogonadism Musculoskeletal: Negative for back pain. Skin: Negative for rash. Neurological: Negative for headaches, focal weakness or numbness.  ____________________________________________   PHYSICAL EXAM:  VITAL SIGNS: Date and Time Temp Pulse RR BP SpO2 Weight BMI Who  01/28/21 0818 98.2 F (36.8 C) -- 14 142/94 (Abnormal)   96 % 226 lb (102.5 kg)     Constitutional: Alert and oriented. Well appearing and in no acute distress. Eyes: Conjunctivae are normal. PERRL. EOMI. Head: Atraumatic. Nose: No congestion/rhinnorhea. Mouth/Throat: Mucous membranes are moist.  Oropharynx non-erythematous. Neck: No stridor.   Hematological/Lymphatic/Immunilogical: No cervical lymphadenopathy. Cardiovascular: Normal rate, regular rhythm. Grossly normal heart sounds.  Good peripheral circulation. Respiratory: Normal respiratory effort.  No retractions. Lungs CTAB. Gastrointestinal: Soft and nontender. No distention. No abdominal bruits. No CVA tenderness. Genitourinary: Deferred Musculoskeletal: No lower extremity tenderness nor edema.  No joint effusions. Neurologic:  Normal speech and language. No gross focal neurologic deficits are appreciated. No gait instability. Skin:  Skin is warm, dry and intact. No rash noted. Psychiatric: Mood and affect are normal. Speech and behavior are normal.  ____________________________________________   LABS (all labs ordered are listed, but only abnormal results are displayed)  Component Ref Range & Units 2 wk ago 11 mo ago 1 yr ago  Color, UA  Yellow  yellow  yellow   Clarity, UA  Clear  clear  clear   Glucose, UA Negative Negative  Negative  Negative  Bilirubin, UA  Negative  negative  negative   Ketones, UA  Negative  negative  negative   Spec Grav, UA 1.010 - 1.025 1.010  1.010  1.010   Blood, UA  Negative  negative  negative   pH, UA 5.0 - 8.0 7.0  7.0  7.5    Protein, UA Negative Negative  Negative  Negative   Urobilinogen, UA 0.2 or 1.0 E.U./dL 0.2  0.2  0.2   Nitrite, UA  Negative  negative  negative   Leukocytes, UA Negative Negative  Negative  Negative   Appearance  Clear  light     Odor  None              Specimen Collected: 01/13/21 09:48 Last Resulted: 01/13/21 09:48      Lab Flowsheet    Order Details    View Encounter    Lab and Collection Details    Routing    Result History    View Encounter Conversation        Result Care Coordination   Patient Communication   Add Comments   Seen Back to Top       Other Results from 01/13/2021   Contains abnormal data Testosterone,Free and Total Order: [id number] Status: Final result    Visible to patient: Yes (seen)    Next appt: None    Dx: Routine adult health maintenance    0 Result Notes Component Ref Range & Units 2 wk ago 9 mo ago 11 mo ago 2 yr ago  Testosterone 264 - 916 ng/dL 266  497 CM  687 CM  502 CM   Comment: Adult male reference interval is based on a population of  healthy nonobese males (BMI <30) between 74 and 33 years old.  [place], [place] [phone]. PMID: [id number].   Testosterone, Free 8.7 - 25.1 pg/mL 6.9 Low    16.6    Resulting Agency  [facility]       Narrative Performed by: Maryan Puls Performed at:  [facility]  [address], [place], [place]  [id number]  Lab Director: Rush Farmer MD, Phone:  [phone]    Specimen Collected: 01/13/21 08:59 Last Resulted: 01/15/21 08:13      Lab Flowsheet    Order Details    View Encounter    Lab and Collection Details    Routing    Result History    View Encounter Conversation      CM=Additional comments      Result Care Coordination   Patient Communication   Add Comments   Seen Back to Top          Contains abnormal data CMP12+LP+TP+TSH+6AC+PSA+CBC Order: [id number] Status: Final result    Visible to patient: Yes (seen)    Next appt:  None    Dx: Routine adult health maintenance    0 Result Notes Component Ref Range & Units 2 wk ago 11 mo ago 1 yr ago  Glucose 70 - 99 mg/dL 94  88 R  91 R   Uric Acid 3.8 - 8.4 mg/dL 7.3  7.2 CM  6.8 CM   Comment:            Therapeutic target for gout patients: <6.0  BUN 6 - 20 mg/dL $Remove'16  14  9   'AlTipVt$ Creatinine, Ser 0.76 - 1.27 mg/dL 1.00  1.04  1.06   eGFR >59 mL/min/1.73 102     BUN/Creatinine Ratio 9 - 20 16  13  8 Low    Sodium 134 - 144 mmol/L 138  139  140   Potassium 3.5 - 5.2 mmol/L 4.1  4.3  4.5   Chloride 96 - 106 mmol/L 98  98  101   Calcium 8.7 - 10.2 mg/dL 9.5  9.6  9.3   Phosphorus 2.8 - 4.1 mg/dL 3.7  3.1  3.0   Total Protein 6.0 - 8.5 g/dL 7.8  7.6  7.4   Albumin 4.0 - 5.0 g/dL 4.9  5.2 High   5.0   Globulin, Total 1.5 - 4.5 g/dL 2.9  2.4  2.4   Albumin/Globulin Ratio 1.2 - 2.2 1.7  2.2  2.1   Bilirubin Total 0.0 - 1.2 mg/dL 0.5  0.4  0.5   Alkaline Phosphatase 44 - 121 IU/L 88  68 CM  53 R   LDH 121 - 224 IU/L 156  171  226 High    AST 0 - 40 IU/L $Remov'27  21  27   'piMukL$ ALT 0 - 44 IU/L 37  23  22   GGT 0 - 65 IU/L $Remov'16  24  14   'JJsViL$ Iron 38 - 169 ug/dL 78  65  96   Cholesterol, Total 100 - 199 mg/dL 193  182  160   Triglycerides 0 - 149 mg/dL 81  96  86   HDL >39 mg/dL 48  49  45   VLDL Cholesterol Cal 5 - 40 mg/dL $Remove'15  18  16   'oJCuHtf$ LDL Chol Calc (NIH) 0 - 99 mg/dL 130 High   115 High   99   Chol/HDL Ratio 0.0 - 5.0 ratio 4.0  3.7 CM  3.6 CM   Comment:                                   T. Chol/HDL Ratio                                              Men  Women                                1/2 Avg.Risk  3.4    3.3                                    Avg.Risk  5.0    4.4                                 2X Avg.Risk  9.6    7.1                                 3X Avg.Risk 23.4   11.0   Estimated CHD Risk 0.0 - 1.0 times avg. 0.7  0.6 CM  0.6 CM   Comment: The CHD Risk is based on the T. Chol/HDL ratio. Other  factors affect CHD Risk such as hypertension, smoking,  diabetes, severe  obesity, and family history of  premature CHD.   TSH 0.450 - 4.500 uIU/mL 2.060  2.490  1.600  T4, Total 4.5 - 12.0 ug/dL 7.4  8.7  9.2   T3 Uptake Ratio 24 - 39 % $Re'27  24  27   'rCA$ Free Thyroxine Index 1.2 - 4.9 2.0  2.1  2.5   Prostate Specific Ag, Serum 0.0 - 4.0 ng/mL 0.5     Comment: Roche ECLIA methodology.  According to the American Urological Association, Serum PSA should  decrease and remain at undetectable levels after radical  prostatectomy. The AUA defines biochemical recurrence as an initial  PSA value 0.2 ng/mL or greater followed by a subsequent confirmatory  PSA value 0.2 ng/mL or greater.  Values obtained with different assay methods or kits cannot be used  interchangeably. Results cannot be interpreted as absolute evidence  of the presence or absence of malignant disease.   WBC 3.4 - 10.8 x10E3/uL 5.1  4.7  4.7   RBC 4.14 - 5.80 x10E6/uL 5.71  5.13  5.49   Hemoglobin 13.0 - 17.7 g/dL 16.5  15.8  15.9   Hematocrit 37.5 - 51.0 % 47.0  43.6  46.5   MCV 79 - 97 fL 82  85  85   MCH 26.6 - 33.0 pg 28.9  30.8  29.0   MCHC 31.5 - 35.7 g/dL 35.1  36.2 High   34.2   RDW 11.6 - 15.4 % 11.7  12.7  12.8   Platelets 150 - 450 x10E3/uL 317  236  271   Neutrophils Not Estab. % 52  55  58   Lymphs Not Estab. % 37  32  31   Monocytes Not Estab. % $Remove'10  11  10   'ivGAxPv$ Eos Not Estab. % $Remove'1  2  1   'sQvFcpC$ Basos Not Estab. % 0  0  0   Neutrophils Absolute 1.4 - 7.0 x10E3/uL 2.6  2.6  2.7   Lymphocytes Absolute 0.7 - 3.1 x10E3/uL 1.9  1.5  1.5   Monocytes Absolute 0.1 - 0.9 x10E3/uL 0.5  0.5  0.5   EOS (ABSOLUTE) 0.0 - 0.4 x10E3/uL 0.1  0.1  0.0   Basophils Absolute 0.0 - 0.2 x10E3/uL 0.0  0.0  0.0   Immature Granulocytes Not Estab. % 0  0  0            ____________________________________________  EKG   ____________________________________________   ____________________________________________     ____________________________________________   INITIAL IMPRESSION / ASSESSMENT AND PLAN   As part of my medical decision making, I reviewed the following data within the electronic MEDICAL RECORD NUMBER  Well exam.     Patient continue previous medication follow-up with .      ____________________________________________     ED Discharge Orders          Ordered    orphenadrine (NORFLEX) 100 MG tablet  2 times daily        01/28/21 0835             Note:  This document was prepared using Dragon voice recognition software and may include unintentional dictation errors.

## 2021-01-28 NOTE — Progress Notes (Deleted)
Pt denies any concerns any issues at this time. Pr requesting norflex refill./CL,RMA

## 2021-01-28 NOTE — Progress Notes (Deleted)
[facility] [place]  ____________________________________________   None    (approximate)  I have reviewed the triage vital signs and the nursing notes.   HISTORY  Chief Complaint Annual physical exam    HPI Rick Fletcher is a 36 y.o. male patient presents annual physical exam with no concerning complaints.         Past Medical History:  Diagnosis Date   Hypogonadism male     There are no problems to display for this patient.   Past Surgical History:  Procedure Laterality Date   NASAL SEPTUM SURGERY  2012   Dr. Jenne Campus   TONSILLECTOMY      Prior to Admission medications   Medication Sig Start Date End Date Taking? Authorizing Provider  aluminum chloride (DRYSOL) 20 % external solution Apply topically at bedtime. 11/11/20  Yes Joni Reining, PA-C  clotrimazole-betamethasone (LOTRISONE) cream Apply 1 application topically daily. 11/11/20  Yes Joni Reining, PA-C  Multiple Vitamins-Minerals (MULTIVITAMIN ADULTS PO) Take 1 tablet by mouth daily.   Yes [provider]  naproxen (NAPROSYN) 500 MG tablet Take 1 tablet (500 mg total) by mouth 2 (two) times daily with a meal. 11/27/20  Yes Joni Reining, PA-C  orphenadrine (NORFLEX) 100 MG tablet Take 1 tablet (100 mg total) by mouth 2 (two) times daily. 11/27/20  Yes Joni Reining, PA-C  XYOSTED 75 MG/0.5ML SOAJ SMARTSIG:1 Pre-Filled Pen Syringe SUB-Q Once a Week 10/11/20   [provider]    Allergies Patient has no known allergies.  History reviewed. No pertinent family history.  Social History Social History   Tobacco Use   Smoking status: Never   Smokeless tobacco: Never    Review of Systems Constitutional: No fever/chills Eyes: No visual changes. ENT: No sore throat. Cardiovascular: Denies chest pain. Respiratory: Denies shortness of breath. Gastrointestinal: No abdominal pain.  No nausea, no vomiting.  No diarrhea.  No constipation. Genitourinary: Negative for dysuria.   Hypogonadism Musculoskeletal: Negative for back pain. Skin: Negative for rash. Neurological: Negative for headaches, focal weakness or numbness. ____________________________________________   PHYSICAL EXAM:  VITAL SIGNS: @EDTRIAGEVITALS @  yesterday Heart RRR oral ER 1 to l: Alert and oriented. Well appearing and in no acute distress. Eyes: Conjunctivae are normal. PERRL. EOMI. Head: Atraumatic. Nose: No congestion/rhinnorhea. Mouth/Throat: Mucous membranes are moist.  Oropharynx non-erythematous. Neck: No stridor.  {**No cervical spine tenderness to palpation.**} {**Hematological/Lymphatic/Immunilogical: No cervical lymphadenopathy. **}Cardiovascular: Normal rate, regular rhythm. Grossly normal heart sounds.  Good peripheral circulation. Respiratory: Normal respiratory effort.  No retractions. Lungs CTAB. Gastrointestinal: Soft and nontender. No distention. No abdominal bruits. No CVA tenderness. {**Genitourinary:  **}Musculoskeletal: No lower extremity tenderness nor edema.  No joint effusions. Neurologic:  Normal speech and language. No gross focal neurologic deficits are appreciated. No gait instability. Skin:  Skin is warm, dry and intact. No rash noted. Psychiatric: Mood and affect are normal. Speech and behavior are normal.  ____________________________________________   LABS (all labs ordered are listed, but only abnormal results are displayed)  @EDLABS @ ____________________________________________  EKG  *** ____________________________________________  RADIOLOGY I, , personally viewed and evaluated these images (plain radiographs) as part of my medical decision making, as well as reviewing the written report by the radiologist.  ED MD interpretation:  ***  Official radiology report(s): No results found.  ____________________________________________   PROCEDURES  Procedure(s) performed (including Critical  Care):  Procedures   ____________________________________________   INITIAL IMPRESSION / ASSESSMENT AND PLAN / ED COURSE  As part of  my medical decision making, I reviewed the following data within the electronic MEDICAL RECORD NUMBER {[id number]::"Notes from prior ED visits","[place] Controlled Substance Database"}        ***  @EDCOURSE @   ____________________________________________   FINAL CLINICAL IMPRESSION(S) / ED DIAGNOSES  @EDCI @   ED Discharge Orders          Ordered    orphenadrine (NORFLEX) 100 MG tablet  2 times daily        Pending             Note:  This document was prepared using Dragon voice recognition software and may include unintentional dictation errors.

## 2021-03-17 ENCOUNTER — Other Ambulatory Visit: Payer: 59

## 2021-03-24 ENCOUNTER — Other Ambulatory Visit: Payer: Self-pay

## 2021-03-24 DIAGNOSIS — E291 Testicular hypofunction: Secondary | ICD-10-CM

## 2021-03-24 NOTE — Progress Notes (Signed)
Pt presents today for outside provider [facility] testosterone labs.

## 2021-03-29 LAB — TESTOSTERONE,FREE AND TOTAL
Testosterone, Free: 6.8 pg/mL — ABNORMAL LOW (ref 8.7–25.1)
Testosterone: 316 ng/dL (ref 264–916)

## 2021-04-28 ENCOUNTER — Other Ambulatory Visit: Payer: Self-pay

## 2021-04-28 DIAGNOSIS — S39012A Strain of muscle, fascia and tendon of lower back, initial encounter: Secondary | ICD-10-CM

## 2021-04-28 MED ORDER — ORPHENADRINE CITRATE ER 100 MG PO TB12: 100.0000 mg | ORAL_TABLET | Freq: Two times a day (BID) | ORAL | 0 refills | Status: DC

## 2021-07-31 ENCOUNTER — Other Ambulatory Visit: Payer: Self-pay

## 2021-07-31 DIAGNOSIS — Z79899 Other long term (current) drug therapy: Secondary | ICD-10-CM

## 2021-07-31 NOTE — Progress Notes (Signed)
Pt presents today for Dr.Wolff labs.

## 2021-08-04 LAB — CMP12+LP+TP+TSH+6AC+PSA+CBC…
ALT: 33 IU/L (ref 0–44)
AST: 25 IU/L (ref 0–40)
Albumin/Globulin Ratio: 1.8 (ref 1.2–2.2)
Albumin: 4.8 g/dL (ref 4.0–5.0)
Alkaline Phosphatase: 80 IU/L (ref 44–121)
BUN/Creatinine Ratio: 15 (ref 9–20)
BUN: 15 mg/dL (ref 6–20)
Basophils Absolute: 0 10*3/uL (ref 0.0–0.2)
Basos: 1 %
Bilirubin Total: 0.6 mg/dL (ref 0.0–1.2)
Calcium: 9.7 mg/dL (ref 8.7–10.2)
Chloride: 99 mmol/L (ref 96–106)
Chol/HDL Ratio: 3.8 ratio (ref 0.0–5.0)
Cholesterol, Total: 187 mg/dL (ref 100–199)
Creatinine, Ser: 1.01 mg/dL (ref 0.76–1.27)
EOS (ABSOLUTE): 0 10*3/uL (ref 0.0–0.4)
Eos: 1 %
Estimated CHD Risk: 0.7 times avg. (ref 0.0–1.0)
Free Thyroxine Index: 2.2 (ref 1.2–4.9)
GGT: 21 IU/L (ref 0–65)
Globulin, Total: 2.7 g/dL (ref 1.5–4.5)
Glucose: 91 mg/dL (ref 70–99)
HDL: 49 mg/dL (ref >39)
Hematocrit: 47.1 % (ref 37.5–51.0)
Hemoglobin: 16.4 g/dL (ref 13.0–17.7)
Immature Grans (Abs): 0 10*3/uL (ref 0.0–0.1)
Immature Granulocytes: 0 %
Iron: 80 ug/dL (ref 38–169)
LDH: 161 IU/L (ref 121–224)
LDL Chol Calc (NIH): 124 mg/dL — ABNORMAL HIGH (ref 0–99)
Lymphocytes Absolute: 1.6 10*3/uL (ref 0.7–3.1)
Lymphs: 31 %
MCH: 28.7 pg (ref 26.6–33.0)
MCHC: 34.8 g/dL (ref 31.5–35.7)
MCV: 83 fL (ref 79–97)
Monocytes Absolute: 0.6 10*3/uL (ref 0.1–0.9)
Monocytes: 11 %
Neutrophils Absolute: 2.9 10*3/uL (ref 1.4–7.0)
Neutrophils: 56 %
Phosphorus: 3.5 mg/dL (ref 2.8–4.1)
Platelets: 298 10*3/uL (ref 150–450)
Potassium: 4.1 mmol/L (ref 3.5–5.2)
Prostate Specific Ag, Serum: 0.6 ng/mL (ref 0.0–4.0)
RBC: 5.71 x10E6/uL (ref 4.14–5.80)
RDW: 13.1 % (ref 11.6–15.4)
Sodium: 140 mmol/L (ref 134–144)
T3 Uptake Ratio: 28 % (ref 24–39)
T4, Total: 7.9 ug/dL (ref 4.5–12.0)
TSH: 1.86 u[IU]/mL (ref 0.450–4.500)
Total Protein: 7.5 g/dL (ref 6.0–8.5)
Triglycerides: 74 mg/dL (ref 0–149)
Uric Acid: 7.1 mg/dL (ref 3.8–8.4)
VLDL Cholesterol Cal: 14 mg/dL (ref 5–40)
WBC: 5.2 10*3/uL (ref 3.4–10.8)
eGFR: 100 mL/min/{1.73_m2} (ref >59)

## 2021-08-04 LAB — TESTOSTERONE,FREE AND TOTAL
Testosterone, Free: 8.1 pg/mL — ABNORMAL LOW (ref 8.7–25.1)
Testosterone: 367 ng/dL (ref 264–916)

## 2021-08-05 DIAGNOSIS — E291 Testicular hypofunction: Secondary | ICD-10-CM | POA: Diagnosis not present

## 2021-08-05 DIAGNOSIS — Z79899 Other long term (current) drug therapy: Secondary | ICD-10-CM | POA: Diagnosis not present

## 2021-08-13 ENCOUNTER — Encounter: Payer: Self-pay | Admitting: Physician Assistant

## 2021-08-13 ENCOUNTER — Ambulatory Visit: Payer: 59 | Admitting: Physician Assistant

## 2021-08-13 VITALS — BP 141/95 | HR 78 | Temp 97.8°F | Resp 16

## 2021-08-13 DIAGNOSIS — S61210A Laceration without foreign body of right index finger without damage to nail, initial encounter: Secondary | ICD-10-CM

## 2021-08-13 NOTE — Progress Notes (Signed)
Here for [facility] claim with laceration to right index finger stated he cut with his pocket knife.  Area cleansed with saline and Iodine and clean gauze held until seen by provider for eval and sutures.

## 2021-08-13 NOTE — Progress Notes (Signed)
   Subjective: Finger laceration    Patient ID: Rick Fletcher, male    DOB: Mar 18, 1985, 36 y.o.   MRN: [id number]  HPI Patient presents with laceration to the second digit right hand secondary to a knife cut.  Bleeding is controlled with direct pressure.  Patient denies loss sensation or loss of function.  Patent tetanus status not up-to-date.   Review of Systems Negative except for chief complaint    Objective:   Physical Exam  BP is 141/95, pulse 78, respirations 16, temperature 97.8, patient 90% O2 sat on room air. 1 cm laceration palmar aspect of second digit right hand.  Bleeding is controlled with direct pressure.  Neurovascular intact with free and equal l range of motion.  No imagings were obtained.      Assessment & Plan: Finger laceration.  Patient given tetanus prophylactic.  Patient gave consent for suturing. Digital block was obtained using 3 cc of lidocaine without epinephrine.  Area was surgical clean and closed with 7 interrupted 5-0 nylon sutures.  Areas reclean bandage and splint.  Patient given discharge care instructions and advised over-the-counter extra strength Tylenol or ibuprofen for pain.  Patient has sutures removed in 10 days.  Patient is placed on modified duty until suture removal.

## 2021-08-13 NOTE — Progress Notes (Signed)
Gave pt 325mg  tylenol per pt request before provider sutured finger

## 2021-08-25 ENCOUNTER — Encounter: Payer: Self-pay | Admitting: Physician Assistant

## 2021-08-25 ENCOUNTER — Other Ambulatory Visit: Payer: 59 | Admitting: Physician Assistant

## 2021-08-25 ENCOUNTER — Ambulatory Visit: Payer: Self-pay | Admitting: Physician Assistant

## 2021-08-25 VITALS — BP 132/85 | HR 74 | Temp 98.4°F | Resp 16

## 2021-08-25 DIAGNOSIS — S61210A Laceration without foreign body of right index finger without damage to nail, initial encounter: Secondary | ICD-10-CM

## 2021-08-25 NOTE — Progress Notes (Signed)
   Subjective: Wound check    Patient ID: Rick Fletcher, male    DOB: 1986/01/01, 36 y.o.   MRN: [id number]  HPI Patient here secondary to wound check of the left index finger.  Patient seen a laceration on 08/13/2021.  Wound was sutured in the clinic, and wife removed sutures 2 days ago.  Patient denies any complaints at this time.   Review of Systems Negative except for chief complaint    Objective:   Physical Exam  BP is 132/85, pulse 74, respirations 16, temperature 98.4, patient 97% O2 sat on room air. Examination of the lacerated site reveal incomplete healing.  Finger is neurovascular intact with free and equal range of motion.  Nontender to palpation.      Assessment & Plan: Healing finger laceration.   Steri-Strips were applied and patient given discharge care instructions.  Follow-up if condition worsens.

## 2021-08-25 NOTE — Progress Notes (Signed)
Here for f/u post WC laceration right index finger.  Stated stitches removed at home by nurse wife.  Wound open and recommended clean and bandaid until flap of loose skin heals.

## 2021-12-01 ENCOUNTER — Encounter: Payer: Self-pay | Admitting: Physician Assistant

## 2021-12-01 ENCOUNTER — Ambulatory Visit: Payer: Self-pay | Admitting: Physician Assistant

## 2021-12-01 DIAGNOSIS — S39012A Strain of muscle, fascia and tendon of lower back, initial encounter: Secondary | ICD-10-CM

## 2021-12-01 MED ORDER — ORPHENADRINE CITRATE ER 100 MG PO TB12: 100.0000 mg | ORAL_TABLET | Freq: Two times a day (BID) | ORAL | 0 refills | Status: DC

## 2021-12-01 NOTE — Progress Notes (Signed)
   Subjective: Low back pain    Patient ID: Rick Fletcher, male    DOB: 09-Aug-1985, 36 y.o.   MRN: [id number]  HPI Patient presents with right low back pain for 4 days.  Patient relates repetitive heavy lifting at home and at work.  Patient denies radicular component to back pain.  Patient denies bladder or bowel dysfunction.  Rates pain as a 7/10.  Described pain as "achy/spasmatic".  Mild relief with anti-inflammatory medication and heat.   Review of Systems Negative except for chief complaint    Objective:   Physical Exam  BP is 123/75, pulse 74, respiration 12, temperature 98.6, patient 96% O2 sat on room air. Examination of back reveals no obvious lumbar deformity.  No guarding palpation of the CVA area.  No guarding palpation of the lumbar spinal area.  Patient has increased right paraspinal muscle spasm with lateral movements.  Patient has negative straight leg test.      Assessment & Plan: Low back strain.   Patient given discharge care instruction a prescription for Norflex.  Patient advised to take this along with the naproxen and follow-up with no improvement in 3 to 5 days.

## 2021-12-01 NOTE — Progress Notes (Signed)
Low back pain right side started Friday when he woke up.  Not sure if he's done anything to aggravate it.  Had 3 muscle relaxers from previous Rx - took last one this morning.  Has also been taking Tylenol & ibuprofen. No ice or heat  AMD

## 2021-12-09 DIAGNOSIS — H52223 Regular astigmatism, bilateral: Secondary | ICD-10-CM | POA: Diagnosis not present

## 2022-01-13 ENCOUNTER — Ambulatory Visit: Payer: Self-pay | Admitting: Physician Assistant

## 2022-01-13 ENCOUNTER — Encounter: Payer: Self-pay | Admitting: Physician Assistant

## 2022-01-13 DIAGNOSIS — H6993 Unspecified Eustachian tube disorder, bilateral: Secondary | ICD-10-CM

## 2022-01-13 MED ORDER — FEXOFENADINE-PSEUDOEPHED ER 60-120 MG PO TB12: 1.0000 | ORAL_TABLET | Freq: Two times a day (BID) | ORAL | 0 refills | Status: DC

## 2022-01-13 MED ORDER — METHYLPREDNISOLONE 4 MG PO TBPK: ORAL_TABLET | ORAL | 0 refills | Status: DC

## 2022-01-13 NOTE — Progress Notes (Signed)
Pt presents today with ringing in left ear with fullness for a couple of days.

## 2022-01-13 NOTE — Progress Notes (Signed)
   Subjective: Bilateral ear pain    Patient ID: MATEEN FRANSSEN, male    DOB: [birth year], 36 y.o.   MRN: [id number]  HPI Patient complains of 2 days of bilateral hip pain.  Patient also states this began year.  Patient denies hearing loss or vertigo.  Patient states his daughter was diagnosed with ear infection last week.  Patient states his wife is also complaining of ear pain.  Denies nasal congestion or other URI signs and symptoms.  No recent travel or known contact with COVID-19.   Review of Systems Negative except for chief complaint    Objective:   Physical Exam  HEENT is remarkable edematous bilateral TM.  No visible erythema or drainage. Neck is supple 3 or bruits. Lungs clear to auscultation. Heart regular rate and rhythm.      Assessment & Plan: Eustachian tube dysfunction  Patient given a prescription for Allegra-D and Medrol Dosepak.  Patient advised to follow-up telephonically in 2 days.

## 2022-01-15 ENCOUNTER — Other Ambulatory Visit: Payer: Self-pay | Admitting: Physician Assistant

## 2022-01-15 MED ORDER — AMOXICILLIN 875 MG PO TABS: 875.0000 mg | ORAL_TABLET | Freq: Two times a day (BID) | ORAL | 0 refills | Status: AC

## 2022-01-27 ENCOUNTER — Ambulatory Visit: Payer: Self-pay | Admitting: Urology

## 2022-02-03 ENCOUNTER — Ambulatory Visit: Payer: Self-pay

## 2022-02-03 DIAGNOSIS — R7989 Other specified abnormal findings of blood chemistry: Secondary | ICD-10-CM

## 2022-02-03 DIAGNOSIS — Z0000 Encounter for general adult medical examination without abnormal findings: Secondary | ICD-10-CM

## 2022-02-03 LAB — POCT URINALYSIS DIPSTICK
Bilirubin, UA: NEGATIVE
Blood, UA: NEGATIVE
Glucose, UA: NEGATIVE
Ketones, UA: NEGATIVE
Leukocytes, UA: NEGATIVE
Nitrite, UA: NEGATIVE
Protein, UA: NEGATIVE
Spec Grav, UA: 1.01 (ref 1.010–1.025)
Urobilinogen, UA: 0.2 E.U./dL
pH, UA: 7 (ref 5.0–8.0)

## 2022-02-03 NOTE — Progress Notes (Signed)
Pt presents today for physical labs, will return to clinic for scheduled physical./CL,RMA 

## 2022-02-04 ENCOUNTER — Ambulatory Visit (INDEPENDENT_AMBULATORY_CARE_PROVIDER_SITE_OTHER): Payer: 59 | Admitting: Urology

## 2022-02-04 DIAGNOSIS — E291 Testicular hypofunction: Secondary | ICD-10-CM | POA: Diagnosis not present

## 2022-02-04 MED ORDER — XYOSTED 100 MG/0.5ML ~~LOC~~ SOAJ: SUBCUTANEOUS | 5 refills | Status: DC

## 2022-02-04 NOTE — Progress Notes (Signed)
I, Rick Fletcher,acting as a scribe for [name], MD.,have documented all relevant documentation on the behalf of [name], MD,as directed by  [name], MD while in the presence of [name], MD.   02/04/22 9:25 AM   [name] Mar 17, 1985 [id number]  Chief Complaint  Patient presents with   Establish Care   Hypogonadism    HPI: 37 year-old male with a personal history of hypogonadism previously managed by Dr. [name] seeking to establish care.  He was last seen 01/28/2021 by Dr. [name].  He is now being managed on Xyosted, was increased from 75 mg up to 100 mg last year.  Patient reports that he was initially started on Clomid about 10 years ago by the city of [place] secondary to hypogonadism and libido issues.  This was later changed to injectable testosterone.  He established care with Dr. 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  Several years ago, after having 2 biological children, he ended up switching back to testosterone injections in the form of Xyosted as he is continue to have issues with his libido.  Since switching back, he has been doing well on this medication without them scores below.  He prefers this strongly over Clomid despite having "lower testosterone levels ".  His most recent PSA was 0.5 on 01/09/21.  He is being managed by [name] injectable testosterone 100 mg subcutaneously weekly. He recently had blood work drawn yesterday at the city of [place] .   Androgen Deficiency in the Aging Male     [place] Name 02/04/22 1000         Androgen Deficiency in the Aging Male   Do you have a decrease in libido (sex drive) No     Do you have lack of energy No     Do you have a decrease in strength and/or endurance No     Have you lost height No     Have you noticed a decreased "enjoyment of life" No     Are you sad and/or grumpy No     Are  your erections less strong No     Have you noticed a recent deterioration in your ability to play sports No     Are you falling asleep after dinner No     Has there been a recent deterioration in your work performance No  sx better since been on medication              [place] Name 02/04/22 1034         SHIM: Over the last 6 months:   How do you rate your confidence that you could get and keep an erection? Very High     When you had erections with sexual stimulation, how often were your erections hard enough for penetration (entering your partner)? Almost Always or Always     During sexual intercourse, how often were you able to maintain your erection after you had penetrated (entered) your partner? Almost Always or Always     During sexual intercourse, how difficult was it to maintain your erection to completion of intercourse? Not Difficult     When you attempted sexual intercourse, how often was it satisfactory for you? Almost Always or Always       SHIM Total Score   SHIM 25  IPSS     Row Name 02/04/22 1000         International Prostate Symptom Score   How often have you had the sensation of not emptying your bladder? Less than 1 in 5     How often have you had to urinate less than every two hours? Not at All     How often have you found you stopped and started again several times when you urinated? Not at All     How often have you found it difficult to postpone urination? Not at All     How often have you had a weak urinary stream? Less than 1 in 5 times     How often have you had to strain to start urination? Not at All     How many times did you typically get up at night to urinate? None     Total IPSS Score 2       Quality of Life due to urinary symptoms   If you were to spend the rest of your life with your urinary condition just the way it is now how would you feel about that? Delighted              Score:  1-7 Mild 8-19  Moderate 20-35 Severe    PMH: Past Medical History:  Diagnosis Date   Hypogonadism male     Surgical History: Past Surgical History:  Procedure Laterality Date   NASAL SEPTUM SURGERY  2012   Dr. [name]   TONSILLECTOMY      Home Medications:  Allergies as of 02/04/2022   No Known Allergies      Medication List        Accurate as of February 04, 2022 11:59 PM. If you have any questions, ask your nurse or doctor.          STOP taking these medications    fexofenadine-pseudoephedrine 60-120 MG 12 hr tablet Commonly known as: ALLEGRA-D   methylPREDNISolone 4 MG Tbpk tablet Commonly known as: MEDROL DOSEPAK       TAKE these medications    aluminum chloride 20 % external solution Commonly known as: DRYSOL Apply topically at bedtime.   clotrimazole-betamethasone cream Commonly known as: LOTRISONE Apply 1 application topically daily.   MULTIVITAMIN ADULTS PO Take 1 tablet by mouth daily.   naproxen 500 MG tablet Commonly known as: Naprosyn Take 1 tablet (500 mg total) by mouth 2 (two) times daily with a meal.   orphenadrine 100 MG tablet Commonly known as: NORFLEX Take 1 tablet (100 mg total) by mouth 2 (two) times daily.   Xyosted 54 MG/0.5ML Soaj Generic drug: Testosterone Enanthate SMARTSIG:1 Pre-Filled Pen Syringe SUB-Q Once a Week What changed: Another medication with the same name was added. Make sure you understand how and when to take each.   Xyosted 100 MG/0.5ML Soaj Generic drug: Testosterone Enanthate Once a week What changed: You were already taking a medication with the same name, and this prescription was added. Make sure you understand how and when to take each.        Social History:  reports that he has never smoked. He has never used smokeless tobacco. No history on file for alcohol use and drug use.   Physical Exam: Constitutional:  Alert and oriented, No acute distress. HEENT: [place] AT, moist mucus membranes.  Trachea  midline, no masses. Neurologic: Grossly intact, no focal deficits, moving all 4 extremities. Psychiatric: Normal mood and affect.  Assessment &  Plan:    History of hypogonadism - Managed by Xyosted 100 mg injectable testosterone weekly. He'll sign release records form to obtain previous labs.  -Given that has been stable on this dose, as long as his labs are within normal range, we will have him get labs every 6 months and see [place] annually -Prescription for medication was sent to compounding pharmacy in [place] and is also given a coupon -We did have a discussion today about being on injectable testosterone at a relatively young age which may in fact fertility moving forward should he decide to have another biological child as well as will ultimately likely result in testicular atrophy and perpetuation of hypogonadal state if he elects to discontinue this medication.  He understands these risks.  He also understands importance of close follow-up with labs and PSA screening.  I have reviewed the above documentation for accuracy and completeness, and I agree with the above.   Vanna Scotland, MD   F/u 6 mo labs, annual with labs   [facility] [address] [place], [place] [address] [phone]

## 2022-02-10 LAB — CMP12+LP+TP+TSH+6AC+PSA+CBC…
ALT: 33 IU/L (ref 0–44)
AST: 23 IU/L (ref 0–40)
Albumin/Globulin Ratio: 2.1 (ref 1.2–2.2)
Albumin: 5.2 g/dL — ABNORMAL HIGH (ref 4.1–5.1)
Alkaline Phosphatase: 81 IU/L (ref 44–121)
BUN/Creatinine Ratio: 15 (ref 9–20)
BUN: 15 mg/dL (ref 6–20)
Basophils Absolute: 0 10*3/uL (ref 0.0–0.2)
Basos: 0 %
Bilirubin Total: 0.9 mg/dL (ref 0.0–1.2)
Calcium: 9.7 mg/dL (ref 8.7–10.2)
Chloride: 99 mmol/L (ref 96–106)
Chol/HDL Ratio: 4.5 ratio (ref 0.0–5.0)
Cholesterol, Total: 207 mg/dL — ABNORMAL HIGH (ref 100–199)
Creatinine, Ser: 1.02 mg/dL (ref 0.76–1.27)
EOS (ABSOLUTE): 0.1 10*3/uL (ref 0.0–0.4)
Eos: 1 %
Estimated CHD Risk: 0.9 times avg. (ref 0.0–1.0)
Free Thyroxine Index: 2.2 (ref 1.2–4.9)
GGT: 23 IU/L (ref 0–65)
Globulin, Total: 2.5 g/dL (ref 1.5–4.5)
Glucose: 83 mg/dL (ref 70–99)
HDL: 46 mg/dL (ref >39)
Hematocrit: 50.5 % (ref 37.5–51.0)
Hemoglobin: 17.3 g/dL (ref 13.0–17.7)
Immature Grans (Abs): 0 10*3/uL (ref 0.0–0.1)
Immature Granulocytes: 0 %
Iron: 97 ug/dL (ref 38–169)
LDH: 207 IU/L (ref 121–224)
LDL Chol Calc (NIH): 144 mg/dL — ABNORMAL HIGH (ref 0–99)
Lymphocytes Absolute: 1.4 10*3/uL (ref 0.7–3.1)
Lymphs: 28 %
MCH: 29.3 pg (ref 26.6–33.0)
MCHC: 34.3 g/dL (ref 31.5–35.7)
MCV: 86 fL (ref 79–97)
Monocytes Absolute: 0.5 10*3/uL (ref 0.1–0.9)
Monocytes: 9 %
Neutrophils Absolute: 3.2 10*3/uL (ref 1.4–7.0)
Neutrophils: 62 %
Phosphorus: 2.9 mg/dL (ref 2.8–4.1)
Platelets: 288 10*3/uL (ref 150–450)
Potassium: 3.9 mmol/L (ref 3.5–5.2)
Prostate Specific Ag, Serum: 0.6 ng/mL (ref 0.0–4.0)
RBC: 5.9 x10E6/uL — ABNORMAL HIGH (ref 4.14–5.80)
RDW: 12.4 % (ref 11.6–15.4)
Sodium: 141 mmol/L (ref 134–144)
T3 Uptake Ratio: 27 % (ref 24–39)
T4, Total: 8 ug/dL (ref 4.5–12.0)
TSH: 1.86 u[IU]/mL (ref 0.450–4.500)
Total Protein: 7.7 g/dL (ref 6.0–8.5)
Triglycerides: 96 mg/dL (ref 0–149)
Uric Acid: 7.1 mg/dL (ref 3.8–8.4)
VLDL Cholesterol Cal: 17 mg/dL (ref 5–40)
WBC: 5.1 10*3/uL (ref 3.4–10.8)
eGFR: 99 mL/min/{1.73_m2} (ref >59)

## 2022-02-10 LAB — TESTOSTERONE,FREE AND TOTAL
Testosterone, Free: 7.8 pg/mL — ABNORMAL LOW (ref 8.7–25.1)
Testosterone: 324 ng/dL (ref 264–916)

## 2022-02-11 ENCOUNTER — Encounter: Payer: Self-pay | Admitting: Physician Assistant

## 2022-02-11 ENCOUNTER — Ambulatory Visit: Payer: Self-pay | Admitting: Physician Assistant

## 2022-02-11 DIAGNOSIS — S39012A Strain of muscle, fascia and tendon of lower back, initial encounter: Secondary | ICD-10-CM

## 2022-02-11 MED ORDER — ORPHENADRINE CITRATE ER 100 MG PO TB12: 100.0000 mg | ORAL_TABLET | Freq: Two times a day (BID) | ORAL | 0 refills | Status: DC

## 2022-02-11 NOTE — Progress Notes (Signed)
Pt presents today to complete physical, pt concerned with hematocrit levels due to testosterone.

## 2022-02-11 NOTE — Progress Notes (Signed)
[facility]  ____________________________________________   None    (approximate)  I have reviewed the triage vital signs and the nursing notes.   HISTORY  Chief Complaint Annual Exam   HPI Rick Fletcher is a 37 y.o. male patient presents for annual physical exam.  Patient voiced no concerns or complaints.         Past Medical History:  Diagnosis Date   Hypogonadism male     There are no problems to display for this patient.   Past Surgical History:  Procedure Laterality Date   NASAL SEPTUM SURGERY  2012   Dr. [name]   TONSILLECTOMY      Prior to Admission medications   Medication Sig Start Date End Date Taking? Authorizing Provider  aluminum chloride (DRYSOL) 20 % external solution Apply topically at bedtime. 11/11/20  Yes Sable Feil, PA-C  clotrimazole-betamethasone (LOTRISONE) cream Apply 1 application topically daily. 11/11/20  Yes Sable Feil, PA-C  Multiple Vitamins-Minerals (MULTIVITAMIN ADULTS PO) Take 1 tablet by mouth daily.   Yes [provider]  naproxen (NAPROSYN) 500 MG tablet Take 1 tablet (500 mg total) by mouth 2 (two) times daily with a meal. 11/27/20  Yes Sable Feil, PA-C  Testosterone Enanthate (XYOSTED) 100 MG/0.5ML SOAJ Once a week 02/04/22  Yes [name], MD  orphenadrine (NORFLEX) 100 MG tablet Take 1 tablet (100 mg total) by mouth 2 (two) times daily. 02/11/22   Sable Feil, PA-C    Allergies Patient has no known allergies.  History reviewed. No pertinent family history.  Social History Social History   Tobacco Use   Smoking status: Never   Smokeless tobacco: Never    Review of Systems Constitutional: No fever/chills Eyes: No visual changes. ENT: No sore throat. Cardiovascular: Denies chest pain. Respiratory: Denies shortness of breath. Gastrointestinal: No abdominal pain.  No nausea, no vomiting.  No diarrhea.  No constipation. Genitourinary: Negative for  dysuria. Musculoskeletal: Negative for back pain. Skin: Negative for rash. Neurological: Negative for headaches, focal weakness or numbness.  ____________________________________________   PHYSICAL EXAM:  VITAL SIGNS: BP is 136/80, pulse 80, respiration 14, temperature 98.7, patient is 90% O2 sat on room air.  Patient weighs 229 pounds and BMI is 31.97. Constitutional: Alert and oriented. Well appearing and in no acute distress. Eyes: Conjunctivae are normal. PERRL. EOMI. Head: Atraumatic. Nose: No congestion/rhinnorhea. Mouth/Throat: Mucous membranes are moist.  Oropharynx non-erythematous. Neck: No stridor.  No cervical spine tenderness to palpation. Hematological/Lymphatic/Immunilogical: No cervical lymphadenopathy. Cardiovascular: Normal rate, regular rhythm. Grossly normal heart sounds.  Good peripheral circulation. Respiratory: Normal respiratory effort.  No retractions. Lungs CTAB. Gastrointestinal: Soft and nontender. No distention. No abdominal bruits. No CVA tenderness. Genitourinary: Deferred Musculoskeletal: No lower extremity tenderness nor edema.  No joint effusions. Neurologic:  Normal speech and language. No gross focal neurologic deficits are appreciated. No gait instability. Skin:  Skin is warm, dry and intact. No rash noted. Psychiatric: Mood and affect are normal. Speech and behavior are normal.  ____________________________________________   LABS _          Component Ref Range & Units 8 d ago (02/03/22) 6 mo ago (07/31/21) 1 yr ago (01/13/21) 2 yr ago (02/07/20) 2 yr ago (02/23/19)  Glucose 70 - 99 mg/dL 83 91 94 88 R 91 R  Uric Acid 3.8 - 8.4 mg/dL 7.1 7.1 CM 7.3 CM 7.2 CM 6.8 CM  Comment:  Therapeutic target for gout patients: <6.0  BUN 6 - 20 mg/dL 15 15 16 14 9   Creatinine, Ser 0.76 - 1.27 mg/dL 1.02 1.01 1.00 1.04 1.06  eGFR >59 mL/min/1.73 99 100 102    BUN/Creatinine Ratio 9 - 20 15 15 16 13 8  Low   Sodium 134 - 144 mmol/L 141 140 138 139  140  Potassium 3.5 - 5.2 mmol/L 3.9 4.1 4.1 4.3 4.5  Chloride 96 - 106 mmol/L 99 99 98 98 101  Calcium 8.7 - 10.2 mg/dL 9.7 9.7 9.5 9.6 9.3  Phosphorus 2.8 - 4.1 mg/dL 2.9 3.5 3.7 3.1 3.0  Total Protein 6.0 - 8.5 g/dL 7.7 7.5 7.8 7.6 7.4  Albumin 4.1 - 5.1 g/dL 5.2 High  4.8 R, CM 4.9 R 5.2 High  R 5.0 R  Globulin, Total 1.5 - 4.5 g/dL 2.5 2.7 2.9 2.4 2.4  Albumin/Globulin Ratio 1.2 - 2.2 2.1 1.8 1.7 2.2 2.1  Bilirubin Total 0.0 - 1.2 mg/dL 0.9 0.6 0.5 0.4 0.5  Alkaline Phosphatase 44 - 121 IU/L 81 80 88 68 CM 53 R  LDH 121 - 224 IU/L 207 161 156 171 226 High   AST 0 - 40 IU/L 23 25 27 21 27   ALT 0 - 44 IU/L 33 33 37 23 22  GGT 0 - 65 IU/L 23 21 16 24 14   Iron 38 - 169 ug/dL 97 80 78 65 96  Cholesterol, Total 100 - 199 mg/dL 207 High  187 193 182 160  Triglycerides 0 - 149 mg/dL 96 74 81 96 86  HDL >39 mg/dL 46 49 48 49 45  VLDL Cholesterol Cal 5 - 40 mg/dL 17 14 15 18 16   LDL Chol Calc (NIH) 0 - 99 mg/dL 144 High  124 High  130 High  115 High  99  Chol/HDL Ratio 0.0 - 5.0 ratio 4.5 3.8 CM 4.0 CM 3.7 CM 3.6 CM  Comment:                                   T. Chol/HDL Ratio                                             Men  Women                               1/2 Avg.Risk  3.4    3.3                                   Avg.Risk  5.0    4.4                                2X Avg.Risk  9.6    7.1                                3X Avg.Risk 23.4   11.0  Estimated CHD Risk 0.0 - 1.0 times avg. 0.9 0.7 CM 0.7 CM 0.6 CM 0.6 CM  Comment: The CHD Risk is based on the T. Chol/HDL  ratio. Other factors affect CHD Risk such as hypertension, smoking, diabetes, severe obesity, and family history of premature CHD.  TSH 0.450 - 4.500 uIU/mL 1.860 1.860 2.060 2.490 1.600  T4, Total 4.5 - 12.0 ug/dL 8.0 7.9 7.4 8.7 9.2  T3 Uptake Ratio 24 - 39 % 27 28 27 24 27   Free Thyroxine Index 1.2 - 4.9 2.2 2.2 2.0 2.1 2.5  Prostate Specific Ag, Serum 0.0 - 4.0 ng/mL 0.6 0.6 CM 0.5 CM    Comment: Roche ECLIA  methodology. According to the American Urological Association, Serum PSA should decrease and remain at undetectable levels after radical prostatectomy. The AUA defines biochemical recurrence as an initial PSA value 0.2 ng/mL or greater followed by a subsequent confirmatory PSA value 0.2 ng/mL or greater. Values obtained with different assay methods or kits cannot be used interchangeably. Results cannot be interpreted as absolute evidence of the presence or absence of malignant disease.  WBC 3.4 - 10.8 x10E3/uL 5.1 5.2 5.1 4.7 4.7  RBC 4.14 - 5.80 x10E6/uL 5.90 High  5.71 5.71 5.13 5.49  Hemoglobin 13.0 - 17.7 g/dL 78.4 69.6 29.5 28.4  Hematocrit 37.5 - 51.0 % 50.5 47.1 47.0 43.6 46.5  MCV 79 - 97 fL 86 83 82 85 85  MCH 26.6 - 33.0 pg 29.3 28.7 28.9 30.8 29.0  MCHC 31.5 - 35.7 g/dL 13.2 44.0 10.2 72.5 High  34.2  RDW 11.6 - 15.4 % 12.4 13.1 11.7 12.7 12.8  Platelets 150 - 450 x10E3/uL 288 298 317 236 271  Neutrophils Not Estab. % 62 56 52 55 58  Lymphs Not Estab. % 28 31 37 32 31  Monocytes Not Estab. % 9 11 10 11 10   Eos Not Estab. % 1 1 1 2 1   Basos Not Estab. % 0 1 0 0 0  Neutrophils Absolute 1.4 - 7.0 x10E3/uL 3.2 2.9 2.6 2.6 2.7  Lymphocytes Absolute 0.7 - 3.1 x10E3/uL 1.4 1.6 1.9 1.5 1.5  Monocytes Absolute 0.1 - 0.9 x10E3/uL 0.5 0.6 0.5 0.5 0.5  EOS (ABSOLUTE) 0.0 - 0.4 x10E3/uL 0.1 0.0 0.1 0.1 0.0  Basophils Absolute 0.0 - 0.2 x10E3/uL 0.0 0.0 0.0 0.0 0.0  Immature Granulocytes Not Estab. % 0 0 0 0 0            __         Component Ref Range & Units 8 d ago (02/03/22) 1 yr ago (01/13/21) 2 yr ago (02/07/20) 2 yr ago (02/23/19)  Color, UA  yellow Yellow yellow yellow  Clarity, UA  clear Clear clear clear  Glucose, UA Negative Negative Negative Negative Negative  Bilirubin, UA  neg Negative negative negative  Ketones, UA  neg Negative negative negative  Spec Grav, UA 1.010 - 1.025 1.010 1.010 1.010 1.010  Blood, UA  neg Negative negative negative  pH, UA 5.0 - 8.0  7.0 7.0 7.0 7.5  Protein, UA Negative Negative Negative Negative Negative  Urobilinogen, UA 0.2 or 1.0 E.U./dL 0.2 0.2 0.2 0.2  Nitrite, UA  neg Negative negative negative  Leukocytes, UA Negative Negative Negative Negative Negative           _________________________________________   ____________________________________________     ____________________________________________   INITIAL IMPRESSION / ASSESSMENT AND PLAN  As part of my medical decision making, I reviewed the following data within the electronic MEDICAL RECORD NUMBER         Discussed no acute findings on physical exam and lab results.     ____________________________________________  FINAL CLINICAL IMPRESSION(S) / ED DIAGNOSES  Well exam   ED Discharge Orders          Ordered    orphenadrine (NORFLEX) 100 MG tablet  2 times daily        02/11/22 0847             Note:  This document was prepared using Dragon voice recognition software and may include unintentional dictation errors.

## 2022-02-18 ENCOUNTER — Telehealth: Payer: Self-pay

## 2022-02-18 NOTE — Telephone Encounter (Signed)
Patient was seen on 02/04/22 and request for previous labs (testosterone) was done, previous labs done through [facility] are in epic, under labs tab.

## 2022-02-19 NOTE — Telephone Encounter (Signed)
Labs reviewed.  No changes in dose.  [name], MD

## 2022-02-20 NOTE — Telephone Encounter (Signed)
Patient advised.

## 2022-07-01 ENCOUNTER — Other Ambulatory Visit: Payer: 59

## 2022-07-01 DIAGNOSIS — E291 Testicular hypofunction: Secondary | ICD-10-CM

## 2022-07-03 LAB — HEMATOCRIT: Hematocrit: 49 % (ref 37.5–51.0)

## 2022-07-03 LAB — HEMOGLOBIN: Hemoglobin: 16.8 g/dL (ref 13.0–17.7)

## 2022-07-03 LAB — PSA: Prostate Specific Ag, Serum: 0.6 ng/mL (ref 0.0–4.0)

## 2022-07-03 LAB — TESTOSTERONE: Testosterone: 457 ng/dL (ref 264–916)

## 2022-07-20 ENCOUNTER — Ambulatory Visit: Payer: Self-pay | Admitting: Emergency Medicine

## 2022-07-20 VITALS — BP 132/87 | HR 97 | Temp 98.2°F | Resp 16

## 2022-07-20 DIAGNOSIS — S81811A Laceration without foreign body, right lower leg, initial encounter: Secondary | ICD-10-CM

## 2022-07-20 NOTE — Progress Notes (Signed)
Stated hit right lower anterior leg on piece of metal on 07/16/22.  Note small scratch closed without sign of infection to that site.  Stated pain at times and has used ice and NSAIDS, but wanted [place] to check it.

## 2022-07-20 NOTE — Progress Notes (Signed)
Patient ID: Rick Fletcher, male   DOB: [birth year], 37 y.o.   MRN: [id number]   S:  injury to right anterior lower leg on 07/16/22 when he hit his leg on a piece of metal.  Was wearing pants and tore those.  Was bleeding.  Reports being on his feet in the yard over the weekend, that evening used ice and has taken ibuprofen 600 mg bid.   Last Tdap within 5 years.    O:  Superficial irregular laceration without FB or signs of infection.  No hematoma present.  Total measures 2 cm total.  Small ecchymosis medial ankle area.  No pitting edema.    A:  Laceration right leg  P:  UTD on Tdap.  Keep clean and dry.  Watch for signs of infection and return as needed.  NSADS prn pain and for inflammation.

## 2022-09-21 ENCOUNTER — Telehealth: Payer: Self-pay | Admitting: Urology

## 2022-09-21 MED ORDER — XYOSTED 100 MG/0.5ML ~~LOC~~ SOAJ: SUBCUTANEOUS | 5 refills | Status: DC

## 2022-09-21 NOTE — Telephone Encounter (Signed)
Pt's wife called about pt's refill for [facility].  He used last one last night.  The pharmacy told them they sent a request to [place] on 7/22 and haven't heard anything.  Can you check in to this and call wife back (she's on DPR)?

## 2022-09-21 NOTE — Telephone Encounter (Signed)
I have not seen a request for this medication. Medication pending Dr Delana Meyer signature. Mrs Jemerson advised and is appreciative.

## 2022-09-30 ENCOUNTER — Other Ambulatory Visit: Payer: Self-pay

## 2022-09-30 NOTE — Progress Notes (Signed)
Random DOT UDS and BAT completed per [facility] after consents signed.  Cleared BAT and UDS sent to [facility].

## 2022-10-12 ENCOUNTER — Ambulatory Visit: Payer: Self-pay | Admitting: Physician Assistant

## 2022-10-12 ENCOUNTER — Other Ambulatory Visit: Payer: Self-pay | Admitting: Physician Assistant

## 2022-10-12 ENCOUNTER — Encounter: Payer: Self-pay | Admitting: Physician Assistant

## 2022-10-12 VITALS — BP 131/82 | HR 71 | Temp 97.7°F | Resp 14

## 2022-10-12 DIAGNOSIS — M5410 Radiculopathy, site unspecified: Secondary | ICD-10-CM

## 2022-10-12 DIAGNOSIS — S39012A Strain of muscle, fascia and tendon of lower back, initial encounter: Secondary | ICD-10-CM

## 2022-10-12 MED ORDER — METHYLPREDNISOLONE 4 MG PO TBPK: ORAL_TABLET | ORAL | 0 refills | Status: DC

## 2022-10-12 MED ORDER — ORPHENADRINE CITRATE ER 100 MG PO TB12
100.0000 mg | ORAL_TABLET | Freq: Two times a day (BID) | ORAL | 0 refills | Status: DC
Start: 2022-10-12 — End: 2023-02-05

## 2022-10-12 NOTE — Progress Notes (Signed)
C/O x 1 week left sciatic pain without injury.  Stated his left foot is going numb.  He used a Norflex he had left and it didn't help.  He is also using Tylenol and Motrin.

## 2022-10-12 NOTE — Progress Notes (Signed)
Subjective: Radicular back pain    Patient ID: Rick Fletcher, male    DOB: 1985-10-02, 37 y.o.   MRN: [id number]  HPI Patient complain of radicular back pain for 1 week.  States numbness and tingling down the left lower extremity.  Also states transient numbness to the toes of the left lower extremity.  Onset of complaint status post prolonged riding a tractor.  Denies bladder or bowel dysfunction.   Review of Systems Negative except for above complaint    Objective:   Physical Exam BP 131/82  Pulse 71  Resp 14  Temp 97.7 F (36.5 C)  SpO2 99 %  Patient is standing in the office states feel better than sitting.  Examination of the lower back reveals no obvious deformity.  There is no guarding with palpation of spinal processes.  Patient has decreased range of motion with right lateral movements.  Describes a "catch" in the left paraspinal muscle group.  Patient has negative straight leg test.       Assessment & Plan: Radicular back pain  A trial of Norflex and Medrol Dosepak.  Patient advised to follow-up in 4 days if no improvement or worsening complaint.

## 2022-10-12 NOTE — Progress Notes (Deleted)
Subjective: Radicular back pain    Patient ID: Rick Fletcher, male    DOB: 12-16-1985, 37 y.o.   MRN: [id number]  HPI Patient complaining of radicular back pain to the left lower extremity x 1 week.  Patient states this also transit numbness in his toes.  Onset of complaint was after prolonged period of riding a tractor.  Denies bladder or bowel dysfunction.   Review of Systems Negative except for above complaint.   Objective:   Physical Exam        Assessment & Plan:

## 2022-10-19 ENCOUNTER — Ambulatory Visit: Payer: Self-pay | Admitting: Physician Assistant

## 2022-10-19 ENCOUNTER — Ambulatory Visit
Admission: RE | Admit: 2022-10-19 | Discharge: 2022-10-19 | Disposition: A | Discharge status: Home / Self Care | Payer: 59 | Attending: Physician Assistant | Admitting: Physician Assistant

## 2022-10-19 ENCOUNTER — Encounter: Payer: Self-pay | Admitting: Physician Assistant

## 2022-10-19 ENCOUNTER — Ambulatory Visit
Admission: RE | Admit: 2022-10-19 | Discharge: 2022-10-19 | Disposition: A | Discharge status: Home / Self Care | Payer: 59 | Source: Ambulatory Visit | Attending: Physician Assistant | Admitting: Physician Assistant

## 2022-10-19 VITALS — BP 128/83 | HR 79 | Temp 98.3°F | Resp 16

## 2022-10-19 DIAGNOSIS — M5410 Radiculopathy, site unspecified: Secondary | ICD-10-CM

## 2022-10-19 NOTE — Progress Notes (Signed)
Subjective: Follow-up radicular back pain    Patient ID: Rick Fletcher, male    DOB: 22-Oct-1985, 37 y.o.   MRN: [id number]  HPI Patient here today for follow-up from visit on 10/12/2022 for radicular back pain.  Patient to extended care after prolonged operation of a tractor.  Patient was given muscle relaxers and Medrol Dosepak.  Patient stated no relief.  Patient stated having left leg cramp which has been he was secondary to a camping trip this past weekend where he did a lot of walking.  Denies bladder or bowel dysfunction.   Review of Systems Negative except except for above complaint    Objective:   Physical Exam BP 128/83  BP Location Left Arm  Patient Position Sitting  Cuff Size Large  Pulse 79  Resp 16  Temp 98.3 F (36.8 C)  Temp src Oral  SpO2 97 %  No acute distress.  No obvious deformity of the lumbar spine.  Patient has full and equal range of motion of the lumbar spine.  Negative straight leg test.  Vascular intact.       Assessment & Plan: Left radicular back pain.  Further evaluation with x-ray.  Patient will follow-up in 2 days status post x-ray report.  Will consider consult to orthopedics.

## 2022-10-19 NOTE — Progress Notes (Signed)
F/U from last Monday (10/15/22) & states medication prescribed to him hasn't helped.  Pain starts at left buttock & radiates all the way down the leg & foot is numb.  States it's about the same as last week. Calf muscle is tight - unsure if R/T, but it's the same leg (did go camping this past weekend & walking around a lot)  Has been taking ibuprofen, aleve & tylenol but hasn't taken anything today.  Initially started after being on a tractor.  Finished prednisone Saturday

## 2022-10-19 NOTE — Addendum Note (Signed)
Addended by: Joni Reining on: 10/19/2022 09:08 AM   Modules accepted: Orders

## 2022-10-21 ENCOUNTER — Encounter: Payer: Self-pay | Admitting: Physician Assistant

## 2022-10-21 ENCOUNTER — Ambulatory Visit: Payer: Self-pay | Admitting: Physician Assistant

## 2022-10-21 VITALS — BP 140/87 | HR 77 | Temp 98.6°F | Resp 12

## 2022-10-21 DIAGNOSIS — M5410 Radiculopathy, site unspecified: Secondary | ICD-10-CM

## 2022-10-21 NOTE — Addendum Note (Signed)
Addended by: Gardner Candle on: 10/21/2022 04:22 PM   Modules accepted: Orders

## 2022-10-21 NOTE — Progress Notes (Signed)
Left leg pain radiating down leg & causing left foot numbness.  States it's about the same today as it was on Monday.  Review xray results.  AMD

## 2022-10-21 NOTE — Progress Notes (Signed)
Subjective: Left radicular back pain    Patient ID: Rick Fletcher, male    DOB: 1985-02-28, 37 y.o.   MRN: [id number]  HPI Patient is follow-up for radicular back pain to the left lower extremity.  Onset of complaint was 10/12/2022.  Patient has a history of chronic back pain over the past 9 months.  Complaint consist mostly of a musculoskeletal complaint which was in the past relieved by anti-inflammatories and muscle relaxants.  This is a new onset of the radicular component.  Patient denies bladder or bowel dysfunction.  Review of Systems Negative except for above complaint    Objective:   Physical Exam BP 140/87  BP Location Left Arm  Patient Position Sitting  Cuff Size Large  Pulse 77  Resp 12  Temp 98.6 F (37 C)  Temp src Temporal  SpO2 97 %  Patient appears no acute distress.  It is noted that patient is always standing for the past 3 appointments.  Physical exam was deferred today as we reviewed x-rays that were taken on 10/19/2022.  Definitive report is not in but i a noticeable osteophyte is present on L4.       Assessment & Plan: Left lumbar radiculopathy  Intermitting back pain for 9 months with new onset radicular component to the left lower extremity.  Will consult to orthopedic for definitive evaluation and treatment.

## 2022-10-27 ENCOUNTER — Other Ambulatory Visit: Payer: Self-pay | Admitting: Sports Medicine

## 2022-10-27 DIAGNOSIS — M51372 Other intervertebral disc degeneration, lumbosacral region with discogenic back pain and lower extremity pain: Secondary | ICD-10-CM

## 2022-10-27 DIAGNOSIS — M5416 Radiculopathy, lumbar region: Secondary | ICD-10-CM

## 2022-10-27 DIAGNOSIS — G8929 Other chronic pain: Secondary | ICD-10-CM

## 2022-11-11 ENCOUNTER — Ambulatory Visit
Admission: RE | Admit: 2022-11-11 | Discharge: 2022-11-11 | Disposition: A | Discharge status: Home / Self Care | Payer: 59 | Source: Ambulatory Visit | Attending: Sports Medicine

## 2022-11-11 DIAGNOSIS — G8929 Other chronic pain: Secondary | ICD-10-CM

## 2022-11-11 DIAGNOSIS — M51372 Other intervertebral disc degeneration, lumbosacral region with discogenic back pain and lower extremity pain: Secondary | ICD-10-CM

## 2022-11-11 DIAGNOSIS — M5416 Radiculopathy, lumbar region: Secondary | ICD-10-CM

## 2023-02-01 ENCOUNTER — Other Ambulatory Visit: Payer: Self-pay

## 2023-02-01 DIAGNOSIS — E291 Testicular hypofunction: Secondary | ICD-10-CM

## 2023-02-01 DIAGNOSIS — Z125 Encounter for screening for malignant neoplasm of prostate: Secondary | ICD-10-CM

## 2023-02-02 ENCOUNTER — Other Ambulatory Visit: Payer: 59

## 2023-02-02 DIAGNOSIS — Z125 Encounter for screening for malignant neoplasm of prostate: Secondary | ICD-10-CM

## 2023-02-02 DIAGNOSIS — E291 Testicular hypofunction: Secondary | ICD-10-CM

## 2023-02-02 NOTE — Progress Notes (Signed)
 02/05/2023 9:13 AM   Rick Fletcher 20-Oct-1985 [id number]  Referring provider: [name], PA-C [address] [place],  [place] [address]  Urological history: 1.  Hypogonadism -Contributing factors obesity -testosterone  level (01/2023) 579 -Hemoglobin/hematocrit (01/2023) 17.2/51.0 -Xyosted  100 mg SQ every 7 days  Chief Complaint  Patient presents with   Follow-up    1 year follow-up   HPI: Rick Fletcher is a 38 y.o. male who presents today for yearly follow up.    Previous records reviewed.   PSA (01/2023) 0.6   I PSS 0/0  He has no urinary complaints.  Patient denies any modifying or aggravating factors.  Patient denies any recent UTI's, gross hematuria, dysuria or suprapubic/flank pain.  Patient denies any fevers, chills, nausea or vomiting.     IPSS     Row Name 02/05/23 0800         International Prostate Symptom Score   How often have you had the sensation of not emptying your bladder? Not at All     How often have you had to urinate less than every two hours? Not at All     How often have you found you stopped and started again several times when you urinated? Not at All     How often have you found it difficult to postpone urination? Not at All     How often have you had a weak urinary stream? Not at All     How often have you had to strain to start urination? Not at All     Total IPSS Score 0       Quality of Life due to urinary symptoms   If you were to spend the rest of your life with your urinary condition just the way it is now how would you feel about that? Delighted              Score:  1-7 Mild 8-19 Moderate 20-35 Severe   SHIM 25  Patient still having spontaneous erections.   He denies any pain or curvature with erections.     SHIM     Row Name 02/05/23 0854         SHIM: Over the last 6 months:   How do you rate your confidence that you could get and keep an erection? Very High     When you had erections with  sexual stimulation, how often were your erections hard enough for penetration (entering your partner)? Almost Always or Always     During sexual intercourse, how often were you able to maintain your erection after you had penetrated (entered) your partner? Almost Always or Always     During sexual intercourse, how difficult was it to maintain your erection to completion of intercourse? Not Difficult     When you attempted sexual intercourse, how often was it satisfactory for you? Almost Always or Always       SHIM Total Score   SHIM 25              Score: 1-7 Severe ED 8-11 Moderate ED 12-16 Mild-Moderate ED 17-21 Mild ED 22-25 No ED   PMH: Past Medical History:  Diagnosis Date   Hypogonadism male     Surgical History: Past Surgical History:  Procedure Laterality Date   NASAL SEPTUM SURGERY  2012   Dr. Herminio   TONSILLECTOMY      Home Medications:  Allergies as of 02/05/2023   No Known Allergies  Medication List        Accurate as of February 05, 2023  9:13 AM. If you have any questions, ask your nurse or doctor.          STOP taking these medications    albuterol 108 (90 Base) MCG/ACT inhaler Commonly known as: VENTOLIN HFA Stopped by: [name]   clotrimazole -betamethasone  cream Commonly known as: LOTRISONE  Stopped by: [name]   orphenadrine  100 MG tablet Commonly known as: NORFLEX  Stopped by: [name]       TAKE these medications    MULTIVITAMIN ADULTS PO Take 1 tablet by mouth daily.   naproxen  500 MG tablet Commonly known as: Naprosyn  Take 1 tablet (500 mg total) by mouth 2 (two) times daily with a meal.   Xyosted  100 MG/0.5ML Soaj Generic drug: Testosterone  Enanthate Once a week        Allergies: No Known Allergies  Family History: No family history on file.  Social History:  reports that he has never smoked. He has never been exposed to tobacco smoke. He has never used smokeless tobacco. No  history on file for alcohol use and drug use.  ROS: Pertinent ROS in HPI  Physical Exam: BP 131/80   Pulse 73   Ht 5' 11 (1.803 m)   Wt 230 lb (104.3 kg)   BMI 32.08 kg/m   Constitutional:  Well nourished. Alert and oriented, No acute distress. HEENT: [place] AT, moist mucus membranes.  Trachea midline Cardiovascular: No clubbing, cyanosis, or edema. Respiratory: Normal respiratory effort, no increased work of breathing. Neurologic: Grossly intact, no focal deficits, moving all 4 extremities. Psychiatric: Normal mood and affect.  Laboratory Data: Lab Results  Component Value Date   WBC 5.1 02/03/2022   HGB 17.2 02/02/2023   HCT 51.0 02/02/2023   MCV 86 02/03/2022   PLT 288 02/03/2022   Lab Results  Component Value Date   CREATININE 1.02 02/03/2022   Lab Results  Component Value Date   TESTOSTERONE  579 02/02/2023   Lab Results  Component Value Date   TSH 1.860 02/03/2022      Component Value Date/Time   CHOL 207 (H) 02/03/2022 0844   HDL 46 02/03/2022 0844   CHOLHDL 4.5 02/03/2022 0844   LDLCALC 144 (H) 02/03/2022 0844   Lab Results  Component Value Date   AST 23 02/03/2022   Lab Results  Component Value Date   ALT 33 02/03/2022  I have reviewed the labs.   Pertinent Imaging: N/A  Assessment & Plan:    1. Testosterone  deficiency  -testosterone  levels are therapeutic -H & H normal -continue Xyosted  100 mg SQ weekly   2. BPH with LUTS -PSA normal  3. Erectile dysfunction:    - no issues  Return in about 3 months (around 05/06/2023) for testosterone , hemoglobin and hematocrit only.  These notes generated with voice recognition software. I apologize for typographical errors.  Testosterone   [name]  [facility] [address]  [address] [place], [place] [address] [phone]

## 2023-02-03 LAB — HEMATOCRIT: Hematocrit: 51 % (ref 37.5–51.0)

## 2023-02-03 LAB — PSA: Prostate Specific Ag, Serum: 0.6 ng/mL (ref 0.0–4.0)

## 2023-02-03 LAB — HEMOGLOBIN: Hemoglobin: 17.2 g/dL (ref 13.0–17.7)

## 2023-02-03 LAB — TESTOSTERONE: Testosterone: 579 ng/dL (ref 264–916)

## 2023-02-05 ENCOUNTER — Ambulatory Visit (INDEPENDENT_AMBULATORY_CARE_PROVIDER_SITE_OTHER): Payer: 59 | Admitting: Urology

## 2023-02-05 ENCOUNTER — Encounter: Payer: Self-pay | Admitting: Urology

## 2023-02-05 VITALS — BP 131/80 | HR 73 | Ht 71.0 in | Wt 230.0 lb

## 2023-02-05 DIAGNOSIS — E291 Testicular hypofunction: Secondary | ICD-10-CM

## 2023-02-05 DIAGNOSIS — N138 Other obstructive and reflux uropathy: Secondary | ICD-10-CM

## 2023-02-05 DIAGNOSIS — N401 Enlarged prostate with lower urinary tract symptoms: Secondary | ICD-10-CM | POA: Diagnosis not present

## 2023-02-05 DIAGNOSIS — N529 Male erectile dysfunction, unspecified: Secondary | ICD-10-CM | POA: Diagnosis not present

## 2023-02-05 MED ORDER — XYOSTED 100 MG/0.5ML ~~LOC~~ SOAJ: SUBCUTANEOUS | 5 refills | Status: DC

## 2023-02-10 ENCOUNTER — Ambulatory Visit: Payer: Self-pay

## 2023-02-10 DIAGNOSIS — Z0000 Encounter for general adult medical examination without abnormal findings: Secondary | ICD-10-CM

## 2023-02-10 LAB — POCT URINALYSIS DIPSTICK
Bilirubin, UA: NEGATIVE
Blood, UA: NEGATIVE
Glucose, UA: NEGATIVE
Ketones, UA: NEGATIVE
Leukocytes, UA: NEGATIVE
Nitrite, UA: NEGATIVE
Protein, UA: NEGATIVE
Spec Grav, UA: 1.01 (ref 1.010–1.025)
Urobilinogen, UA: 0.2 U/dL
pH, UA: 7 (ref 5.0–8.0)

## 2023-02-10 NOTE — Progress Notes (Signed)
 Pt completed all requirements for physical. CL,RMA

## 2023-02-11 LAB — CMP12+LP+TP+TSH+6AC+CBC/D/PLT
ALT: 51 [IU]/L — ABNORMAL HIGH (ref 0–44)
AST: 32 [IU]/L (ref 0–40)
Albumin: 5 g/dL (ref 4.1–5.1)
Alkaline Phosphatase: 81 [IU]/L (ref 44–121)
BUN/Creatinine Ratio: 14 (ref 9–20)
BUN: 15 mg/dL (ref 6–20)
Basophils Absolute: 0 10*3/uL (ref 0.0–0.2)
Basos: 1 %
Bilirubin Total: 0.5 mg/dL (ref 0.0–1.2)
Calcium: 9.5 mg/dL (ref 8.7–10.2)
Chloride: 95 mmol/L — ABNORMAL LOW (ref 96–106)
Chol/HDL Ratio: 5.1 {ratio} — ABNORMAL HIGH (ref 0.0–5.0)
Cholesterol, Total: 233 mg/dL — ABNORMAL HIGH (ref 100–199)
Creatinine, Ser: 1.04 mg/dL (ref 0.76–1.27)
EOS (ABSOLUTE): 0.1 10*3/uL (ref 0.0–0.4)
Eos: 2 %
Estimated CHD Risk: 1 times avg. (ref 0.0–1.0)
Free Thyroxine Index: 2.6 (ref 1.2–4.9)
GGT: 22 [IU]/L (ref 0–65)
Globulin, Total: 2.7 g/dL (ref 1.5–4.5)
Glucose: 84 mg/dL (ref 70–99)
HDL: 46 mg/dL (ref >39)
Hematocrit: 53 % — ABNORMAL HIGH (ref 37.5–51.0)
Hemoglobin: 17.8 g/dL — ABNORMAL HIGH (ref 13.0–17.7)
Immature Grans (Abs): 0 10*3/uL (ref 0.0–0.1)
Immature Granulocytes: 0 %
Iron: 72 ug/dL (ref 38–169)
LDH: 191 [IU]/L (ref 121–224)
LDL Chol Calc (NIH): 163 mg/dL — ABNORMAL HIGH (ref 0–99)
Lymphocytes Absolute: 1.5 10*3/uL (ref 0.7–3.1)
Lymphs: 29 %
MCH: 29 pg (ref 26.6–33.0)
MCHC: 33.6 g/dL (ref 31.5–35.7)
MCV: 87 fL (ref 79–97)
Monocytes Absolute: 0.6 10*3/uL (ref 0.1–0.9)
Monocytes: 11 %
Neutrophils Absolute: 3 10*3/uL (ref 1.4–7.0)
Neutrophils: 57 %
Phosphorus: 3.2 mg/dL (ref 2.8–4.1)
Platelets: 342 10*3/uL (ref 150–450)
Potassium: 4.3 mmol/L (ref 3.5–5.2)
RBC: 6.13 x10E6/uL — ABNORMAL HIGH (ref 4.14–5.80)
RDW: 12.7 % (ref 11.6–15.4)
Sodium: 137 mmol/L (ref 134–144)
T3 Uptake Ratio: 29 % (ref 24–39)
T4, Total: 8.9 ug/dL (ref 4.5–12.0)
TSH: 2.5 u[IU]/mL (ref 0.450–4.500)
Total Protein: 7.7 g/dL (ref 6.0–8.5)
Triglycerides: 130 mg/dL (ref 0–149)
Uric Acid: 7.4 mg/dL (ref 3.8–8.4)
VLDL Cholesterol Cal: 24 mg/dL (ref 5–40)
WBC: 5.3 10*3/uL (ref 3.4–10.8)
eGFR: 96 mL/min/{1.73_m2} (ref >59)

## 2023-02-17 ENCOUNTER — Encounter: Payer: 59 | Admitting: Physician Assistant

## 2023-02-17 ENCOUNTER — Encounter: Payer: Self-pay | Admitting: Physician Assistant

## 2023-02-17 ENCOUNTER — Ambulatory Visit: Payer: Self-pay | Admitting: Physician Assistant

## 2023-02-17 VITALS — BP 107/71 | HR 108 | Temp 98.2°F | Resp 12 | Ht 71.0 in | Wt 239.0 lb

## 2023-02-17 DIAGNOSIS — Z0000 Encounter for general adult medical examination without abnormal findings: Secondary | ICD-10-CM

## 2023-02-17 NOTE — Progress Notes (Signed)
[facility] ____________________________________________   None    (approximate)  I have reviewed the triage vital signs and the nursing notes.   HISTORY  Chief Complaint Annual Exam    HPI Rick Fletcher is a 38 y.o. male patient presents for annual physical exam.  Patient was no concerns or complaints.  It was noted that patient cholesterol is elevated to 232.  Triglycerides and HDL within normal limits.         Past Medical History:  Diagnosis Date   Hypogonadism male     There are no active problems to display for this patient.   Past Surgical History:  Procedure Laterality Date   NASAL SEPTUM SURGERY  2012   Dr. Jenne Campus   TONSILLECTOMY      Prior to Admission medications   Medication Sig Start Date End Date Taking? Authorizing Provider  Multiple Vitamins-Minerals (MULTIVITAMIN ADULTS PO) Take 1 tablet by mouth daily.   Yes [provider]  Testosterone Enanthate (XYOSTED) 100 MG/0.5ML SOAJ Once a week 02/05/23  Yes McGowan, Carollee Herter A, PA-C  naproxen (NAPROSYN) 500 MG tablet Take 1 tablet (500 mg total) by mouth 2 (two) times daily with a meal. Patient not taking: Reported on 02/17/2023 11/27/20   Joni Reining, PA-C    Allergies Patient has no known allergies.  History reviewed. No pertinent family history.  Social History Social History   Tobacco Use   Smoking status: Never    Passive exposure: Never   Smokeless tobacco: Never    Review of Systems Constitutional: No fever/chills Eyes: No visual changes. ENT: No sore throat. Cardiovascular: Denies chest pain. Respiratory: Denies shortness of breath. Gastrointestinal: No abdominal pain.  No nausea, no vomiting.  No diarrhea.  No constipation. Genitourinary: Negative for dysuria. Musculoskeletal: Negative for back pain. Skin: Negative for rash. Neurological: Negative for headaches, focal weakness or numbness. Endocrine:  Hypogonadism  ____________________________________________   PHYSICAL EXAM:  VITAL SIGNS: BP 107/71  BP Location Left Arm  Patient Position Sitting  Cuff Size Large  Pulse Rate 108Pulse Rate. 108. Data is abnormal. Taken on 02/17/23 2:42 PM  Temp 98.2 F (36.8 C)  Temp Source Temporal  Weight 239 lb (108.4 kg)  Height 5\' 11"  (1.803 m)  Resp 12   Constitutional: Alert and oriented. Well appearing and in no acute distress. Eyes: Conjunctivae are normal. PERRL. EOMI. Head: Atraumatic. Nose: No congestion/rhinnorhea. Mouth/Throat: Mucous membranes are moist.  Oropharynx non-erythematous. Neck: No stridor. No cervical spine tenderness to palpation. Hematological/Lymphatic/Immunilogical: No cervical lymphadenopathy. Cardiovascular: Normal rate, regular rhythm. Grossly normal heart sounds.  Good peripheral circulation. Respiratory: Normal respiratory effort.  No retractions. Lungs CTAB. Gastrointestinal: Soft and nontender. No distention. No abdominal bruits. No CVA tenderness. Genitourinary: Deferred Musculoskeletal: No lower extremity tenderness nor edema.  No joint effusions. Neurologic:  Normal speech and language. No gross focal neurologic deficits are appreciated. No gait instability. Skin:  Skin is warm, dry and intact. No rash noted. Psychiatric: Mood and affect are normal. Speech and behavior are normal.  ____________________________________________   ____________________________________________     ____________________________________________    ____________________________________________   INITIAL IMPRESSION / ASSESSMENT AND PLAN   As part of my medical decision making, I reviewed the following data within the electronic MEDICAL RECORD NUMBER         No acute findings on physical exam and labs.     ____________________________________________   FINAL CLINICAL IMPRESSION Well exam  ED Discharge Orders     None  Note:  This document was  prepared using Dragon voice recognition software and may include unintentional dictation errors.

## 2023-02-24 ENCOUNTER — Ambulatory Visit: Payer: 59

## 2023-02-24 VITALS — BP 119/73 | HR 85

## 2023-02-24 DIAGNOSIS — Z0130 Encounter for examination of blood pressure without abnormal findings: Secondary | ICD-10-CM

## 2023-02-24 NOTE — Progress Notes (Signed)
Pt presents today for recheck for BP and pulse.

## 2023-03-01 ENCOUNTER — Other Ambulatory Visit: Payer: Self-pay | Admitting: Physician Assistant

## 2023-03-01 ENCOUNTER — Ambulatory Visit
Admission: RE | Admit: 2023-03-01 | Discharge: 2023-03-01 | Disposition: A | Discharge status: Home / Self Care | Payer: 59 | Source: Ambulatory Visit | Attending: Physician Assistant | Admitting: Physician Assistant

## 2023-03-01 ENCOUNTER — Ambulatory Visit: Payer: 59 | Admitting: Physician Assistant

## 2023-03-01 ENCOUNTER — Encounter: Payer: Self-pay | Admitting: Physician Assistant

## 2023-03-01 VITALS — BP 128/72 | HR 86 | Temp 98.6°F | Resp 14

## 2023-03-01 DIAGNOSIS — M79644 Pain in right finger(s): Secondary | ICD-10-CM | POA: Insufficient documentation

## 2023-03-01 NOTE — Progress Notes (Signed)
   Subjective:Right Thumb injury    Patient ID: Rick Fletcher, male    DOB: 15-Jan-1986, 38 y.o.   MRN: [id number]  HPI Patient complaining of right thumb pain secondary to a contusion that occurred earlier today.  Denies loss sensation.  Decreased range of motion with flexion.  No palliative measure for complaint.   Review of Systems Negative except for above complaint    Objective:   Physical Exam BP 128/72  BP Location Left Arm  Cuff Size Large  Pulse Rate 86  Temp 98.6 F (37 C)  Temp Source Temporal  Resp 14  SpO2 96 %  No acute distress. Examination of the right throat shows no edema erythema or ecchymosis.  Patient has moderate guarding at the metacarpal head.  X-rays negative for obvious fracture or deformity.  Definitive report is pending.       Assessment & Plan: Right thumb contusion  Patient placed in a splint and given up naproxen.  Patient given discharge care instruction for use of ice and then starting to heat tomorrow.  Follow-up with no improvement in 3 days.

## 2023-05-06 ENCOUNTER — Other Ambulatory Visit: Payer: Self-pay

## 2023-05-06 DIAGNOSIS — E291 Testicular hypofunction: Secondary | ICD-10-CM

## 2023-05-06 DIAGNOSIS — N529 Male erectile dysfunction, unspecified: Secondary | ICD-10-CM

## 2023-05-06 DIAGNOSIS — N138 Other obstructive and reflux uropathy: Secondary | ICD-10-CM

## 2023-05-07 LAB — HEMOGLOBIN AND HEMATOCRIT, BLOOD
Hematocrit: 48.2 % (ref 37.5–51.0)
Hemoglobin: 16.7 g/dL (ref 13.0–17.7)

## 2023-05-07 LAB — TESTOSTERONE: Testosterone: 108 ng/dL — ABNORMAL LOW (ref 264–916)

## 2023-05-10 ENCOUNTER — Other Ambulatory Visit: Payer: Self-pay

## 2023-05-10 DIAGNOSIS — E291 Testicular hypofunction: Secondary | ICD-10-CM

## 2023-05-13 ENCOUNTER — Other Ambulatory Visit

## 2023-05-13 DIAGNOSIS — E291 Testicular hypofunction: Secondary | ICD-10-CM

## 2023-05-14 LAB — TESTOSTERONE: Testosterone: 500 ng/dL (ref 264–916)

## 2023-05-19 NOTE — Progress Notes (Unsigned)
 05/20/2023 3:03 PM   Rick Fletcher 1985-08-07 [id number]  Referring provider: Marcina Severe, PA-C [address] [place],  [place] [address]  Urological history: 1.  Hypogonadism -testosterone  level (04/2023) 500 -Hemoglobin/hematocrit (04/2023) 16.7/48.2 -Xyosted  100 mg SQ every 7 days  No chief complaint on file.  HPI: Rick Fletcher is a 38 y.o. male who presents today for three month follow up.    Previous records reviewed.   I PSS ***   Score:  1-7 Mild 8-19 Moderate 20-35 Severe   SHIM ***    Score: 1-7 Severe ED 8-11 Moderate ED 12-16 Mild-Moderate ED 17-21 Mild ED 22-25 No ED   PMH: Past Medical History:  Diagnosis Date   Hypogonadism male     Surgical History: Past Surgical History:  Procedure Laterality Date   NASAL SEPTUM SURGERY  2012   Dr. Silvestre Drum   TONSILLECTOMY      Home Medications:  Allergies as of 05/20/2023   No Known Allergies      Medication List        Accurate as of May 19, 2023  3:03 PM. If you have any questions, ask your nurse or doctor.          MULTIVITAMIN ADULTS PO Take 1 tablet by mouth daily.   naproxen  500 MG tablet Commonly known as: Naprosyn  Take 1 tablet (500 mg total) by mouth 2 (two) times daily with a meal.   Xyosted  100 MG/0.5ML Soaj Generic drug: Testosterone  Enanthate Once a week        Allergies: No Known Allergies  Family History: No family history on file.  Social History:  reports that he has never smoked. He has never been exposed to tobacco smoke. He has never used smokeless tobacco. No history on file for alcohol use and drug use.  ROS: Pertinent ROS in HPI  Physical Exam: There were no vitals taken for this visit.  Constitutional:  Well nourished. Alert and oriented, No acute distress. HEENT:  AT, moist mucus membranes.  Trachea midline, no masses. Cardiovascular: No clubbing, cyanosis, or edema. Respiratory: Normal respiratory effort, no increased work  of breathing. GI: Abdomen is soft, non tender, non distended, no abdominal masses. Liver and spleen not palpable.  No hernias appreciated.  Stool sample for occult testing is not indicated.   GU: No CVA tenderness.  No bladder fullness or masses.  Patient with circumcised/uncircumcised phallus. ***Foreskin easily retracted***  Urethral meatus is patent.  No penile discharge. No penile lesions or rashes. Scrotum without lesions, cysts, rashes and/or edema.  Testicles are located scrotally bilaterally. No masses are appreciated in the testicles. Left and right epididymis are normal. Rectal: Patient with  normal sphincter tone. Anus and perineum without scarring or rashes. No rectal masses are appreciated. Prostate is approximately *** grams, *** nodules are appreciated. Seminal vesicles are normal. Skin: No rashes, bruises or suspicious lesions. Lymph: No cervical or inguinal adenopathy. Neurologic: Grossly intact, no focal deficits, moving all 4 extremities. Psychiatric: Normal mood and affect.   Laboratory Data: Results for orders placed or performed in visit on 05/13/23  Testosterone    Collection Time: 05/13/23  3:19 PM  Result Value Ref Range   Testosterone  500 264 - 916 ng/dL  I have reviewed the labs.   Pertinent Imaging: N/A  Assessment & Plan:    1.  Hypogonadism -testosterone  levels are therapeutic -H & H normal -continue Xyosted  100 mg SQ weekly   2. BPH with LUTS -screening up to date  3. Erectile dysfunction:    - no issues  No follow-ups on file.  These notes generated with voice recognition software. I apologize for typographical errors.  Testosterone   Briant Camper  [facility] [address]  [address] [place], [place] [address] [phone]

## 2023-05-20 ENCOUNTER — Ambulatory Visit: Admitting: Physician Assistant

## 2023-05-20 ENCOUNTER — Encounter: Payer: Self-pay | Admitting: Urology

## 2023-05-20 ENCOUNTER — Ambulatory Visit (INDEPENDENT_AMBULATORY_CARE_PROVIDER_SITE_OTHER): Admitting: Urology

## 2023-05-20 VITALS — BP 125/82 | HR 92

## 2023-05-20 DIAGNOSIS — E291 Testicular hypofunction: Secondary | ICD-10-CM

## 2023-05-20 DIAGNOSIS — N138 Other obstructive and reflux uropathy: Secondary | ICD-10-CM | POA: Diagnosis not present

## 2023-05-20 DIAGNOSIS — N401 Enlarged prostate with lower urinary tract symptoms: Secondary | ICD-10-CM | POA: Diagnosis not present

## 2023-05-20 DIAGNOSIS — N529 Male erectile dysfunction, unspecified: Secondary | ICD-10-CM | POA: Diagnosis not present

## 2023-05-20 MED ORDER — XYOSTED 100 MG/0.5ML ~~LOC~~ SOAJ: SUBCUTANEOUS | 5 refills | Status: DC

## 2023-05-20 MED ORDER — AMOXICILLIN 875 MG PO TABS: 875.0000 mg | ORAL_TABLET | Freq: Two times a day (BID) | ORAL | 0 refills | Status: DC

## 2023-08-18 ENCOUNTER — Other Ambulatory Visit

## 2023-08-23 ENCOUNTER — Encounter: Payer: Self-pay | Admitting: Urology

## 2023-08-30 ENCOUNTER — Telehealth: Payer: Self-pay | Admitting: Urology

## 2023-08-30 NOTE — Telephone Encounter (Signed)
 Patient's wife called regarding Xyosted  prescription. She said she spoke with [facility] in [place], and they told her it now requires a prior authorization. The pharmacy told her they sent a request to our office. He is currently out of medication. Please advise patient.

## 2023-08-31 ENCOUNTER — Other Ambulatory Visit: Payer: Self-pay | Admitting: Urology

## 2023-08-31 DIAGNOSIS — E291 Testicular hypofunction: Secondary | ICD-10-CM

## 2023-08-31 MED ORDER — XYOSTED 100 MG/0.5ML ~~LOC~~ SOAJ: SUBCUTANEOUS | 5 refills | Status: AC

## 2024-01-07 ENCOUNTER — Other Ambulatory Visit: Payer: Self-pay | Admitting: *Deleted

## 2024-01-07 DIAGNOSIS — E291 Testicular hypofunction: Secondary | ICD-10-CM

## 2024-02-11 ENCOUNTER — Ambulatory Visit: Payer: Self-pay

## 2024-02-11 DIAGNOSIS — E349 Endocrine disorder, unspecified: Secondary | ICD-10-CM

## 2024-02-11 DIAGNOSIS — Z0000 Encounter for general adult medical examination without abnormal findings: Secondary | ICD-10-CM

## 2024-02-11 LAB — POCT URINALYSIS DIPSTICK
Bilirubin, UA: NEGATIVE
Blood, UA: NEGATIVE
Glucose, UA: NEGATIVE
Ketones, UA: NEGATIVE
Leukocytes, UA: NEGATIVE
Nitrite, UA: NEGATIVE
Protein, UA: NEGATIVE
Spec Grav, UA: <=1.005 — AB
Urobilinogen, UA: 0.2 U/dL
pH, UA: 6.5

## 2024-02-11 NOTE — Progress Notes (Signed)
 Pt will complete hearing at physical. Rick Fletcher

## 2024-02-12 LAB — CMP12+LP+TP+TSH+6AC+PSA+CBC…
ALT: 48 IU/L — ABNORMAL HIGH (ref 0–44)
AST: 28 IU/L (ref 0–40)
Albumin: 5 g/dL (ref 4.1–5.1)
Alkaline Phosphatase: 89 IU/L (ref 47–123)
BUN/Creatinine Ratio: 13 (ref 9–20)
BUN: 13 mg/dL (ref 6–20)
Basophils Absolute: 0 x10E3/uL (ref 0.0–0.2)
Basos: 0 %
Bilirubin Total: 0.7 mg/dL (ref 0.0–1.2)
Calcium: 9.5 mg/dL (ref 8.7–10.2)
Chloride: 97 mmol/L (ref 96–106)
Chol/HDL Ratio: 5 ratio (ref 0.0–5.0)
Cholesterol, Total: 212 mg/dL — ABNORMAL HIGH (ref 100–199)
Creatinine, Ser: 1 mg/dL (ref 0.76–1.27)
EOS (ABSOLUTE): 0.1 x10E3/uL (ref 0.0–0.4)
Eos: 1 %
Estimated CHD Risk: 1 times avg. (ref 0.0–1.0)
Free Thyroxine Index: 2.7 (ref 1.2–4.9)
GGT: 39 IU/L (ref 0–65)
Globulin, Total: 2.9 g/dL (ref 1.5–4.5)
Glucose: 87 mg/dL (ref 70–99)
HDL: 42 mg/dL
Hematocrit: 50.9 % (ref 37.5–51.0)
Hemoglobin: 17.3 g/dL (ref 13.0–17.7)
Immature Grans (Abs): 0.1 x10E3/uL (ref 0.0–0.1)
Immature Granulocytes: 1 %
Iron: 108 ug/dL (ref 38–169)
LDH: 219 IU/L (ref 121–224)
LDL Chol Calc (NIH): 143 mg/dL — ABNORMAL HIGH (ref 0–99)
Lymphocytes Absolute: 2 x10E3/uL (ref 0.7–3.1)
Lymphs: 30 %
MCH: 29.3 pg (ref 26.6–33.0)
MCHC: 34 g/dL (ref 31.5–35.7)
MCV: 86 fL (ref 79–97)
Monocytes Absolute: 0.5 x10E3/uL (ref 0.1–0.9)
Monocytes: 8 %
Neutrophils Absolute: 4.1 x10E3/uL (ref 1.4–7.0)
Neutrophils: 60 %
Phosphorus: 3.1 mg/dL (ref 2.8–4.1)
Platelets: 327 x10E3/uL (ref 150–450)
Potassium: 4.3 mmol/L (ref 3.5–5.2)
Prostate Specific Ag, Serum: 0.7 ng/mL (ref 0.0–4.0)
RBC: 5.91 x10E6/uL — ABNORMAL HIGH (ref 4.14–5.80)
RDW: 12.6 % (ref 11.6–15.4)
Sodium: 139 mmol/L (ref 134–144)
T3 Uptake Ratio: 30 % (ref 24–39)
T4, Total: 9.1 ug/dL (ref 4.5–12.0)
TSH: 2.14 u[IU]/mL (ref 0.450–4.500)
Total Protein: 7.9 g/dL (ref 6.0–8.5)
Triglycerides: 152 mg/dL — ABNORMAL HIGH (ref 0–149)
Uric Acid: 8.3 mg/dL (ref 3.8–8.4)
VLDL Cholesterol Cal: 27 mg/dL (ref 5–40)
WBC: 6.7 x10E3/uL (ref 3.4–10.8)
eGFR: 100 mL/min/1.73

## 2024-02-16 ENCOUNTER — Encounter: Payer: Self-pay | Admitting: Physician Assistant

## 2024-02-16 ENCOUNTER — Ambulatory Visit: Payer: Self-pay | Admitting: Physician Assistant

## 2024-02-16 VITALS — BP 118/66 | HR 78 | Resp 16 | Ht 71.0 in | Wt 242.0 lb

## 2024-02-16 DIAGNOSIS — Z0000 Encounter for general adult medical examination without abnormal findings: Secondary | ICD-10-CM

## 2024-02-16 NOTE — Progress Notes (Signed)
 "  [facility]  ____________________________________________   None    (approximate)  I have reviewed the triage vital signs and the nursing notes.   HISTORY  Chief Complaint Annual Exam  HPI Rick Fletcher is a 39 y.o. 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  Patient voices concern for right lateral knee pain for approximately 6 weeks.  States onset of pain with prolonged sitting and coming to a standing position.  Patient states after moving around while the pain dissipates.  Responds well to oral anti-inflammatory medications.          Past Medical History:  Diagnosis Date   Hypogonadism male     There are no active problems to display for this patient.   Past Surgical History:  Procedure Laterality Date   NASAL SEPTUM SURGERY  2012   Dr. Herminio   TONSILLECTOMY      Prior to Admission medications  Medication Sig Start Date End Date Taking? Authorizing Provider  albuterol (VENTOLIN HFA) 108 (90 Base) MCG/ACT inhaler Inhale 2 puffs into the lungs. 03/17/23   [provider]  amoxicillin  (AMOXIL ) 875 MG tablet Take 1 tablet (875 mg total) by mouth every 12 (twelve) hours. 05/20/23   Helon Kirsch A, PA-C  Multiple Vitamins-Minerals (MULTIVITAMIN ADULTS PO) Take 1 tablet by mouth daily.    [provider]  naproxen  (NAPROSYN ) 500 MG tablet Take 1 tablet (500 mg total) by mouth 2 (two) times daily with a meal. Patient not taking: Reported on 05/20/2023 11/27/20   [name], PA-C  Testosterone  Enanthate (XYOSTED ) 100 MG/0.5ML SOAJ Once a week 08/31/23   Helon Kirsch A, PA-C    Allergies Patient has no known allergies.  No family history on file.  Social History Social History[1]  Review of Systems  Constitutional: No fever/chills Eyes: No visual changes. ENT: No sore throat. Cardiovascular: Denies chest pain. Respiratory: Denies shortness of  breath. Gastrointestinal: No abdominal pain.  No nausea, no vomiting.  No diarrhea.  No constipation. Genitourinary: Negative for dysuria.  Hypogonadism Musculoskeletal: Negative for back pain. Skin: Negative for rash. Neurological: Negative for headaches, focal weakness or numbness.  ____________________________________________   PHYSICAL EXAM:  VITAL SIGNS: BP 118/66  Cuff Size Large  Pulse Rate 78  Weight 242 lb (109.8 kg)  Height 5' 11 (1.803 m)  Resp 16  SpO2 96 %   BMI: 33.75 kg/m2  BSA: 2.35 m2   Constitutional: Alert and oriented. Well appearing and in no acute distress. Eyes: Conjunctivae are normal. PERRL. EOMI. Head: Atraumatic. Nose: No congestion/rhinnorhea. Mouth/Throat: Mucous membranes are moist.  Oropharynx non-erythematous. Neck: No stridor.No cervical spine tenderness to palpation. Hematological/Lymphatic/Immunilogical: No cervical lymphadenopathy. Cardiovascular: Normal rate, regular rhythm. Grossly normal heart sounds.  Good peripheral circulation. Respiratory: Normal respiratory effort.  No retractions. Lungs CTAB. Gastrointestinal: Soft and nontender. No distention. No abdominal bruits. No CVA tenderness. Genitourinary: deferred Musculoskeletal: No lower extremity tenderness nor edema.  No joint effusions. Neurologic:  Normal speech and language. No gross focal neurologic deficits are appreciated. No gait instability. Skin:  Skin is warm, dry and intact. No rash noted. Psychiatric: Mood and affect are normal. Speech and behavior are normal.  ____________________________________________   LABS  _____         Component Ref Range & Units (hover) 5 d ago (02/11/24) 1 yr ago (02/10/23) 2 yr ago (02/03/22) 3 yr ago (01/13/21) 4 yr ago (02/07/20) 4 yr ago (02/23/19)  Color,  UA pale yellow yellow Yellow yellow yellow  Clarity, UA clear clear clear Clear clear clear  Glucose, UA Negative Negative Negative Negative Negative Negative  Bilirubin, UA neg  neg neg Negative negative negative  Ketones, UA neg neg neg Negative negative negative  Spec Grav, UA <=1.005 Abnormal  1.010 1.010 1.010 1.010 1.010  Blood, UA neg neg neg Negative negative negative  pH, UA 6.5 7.0 7.0 7.0 7.0 7.5  Protein, UA Negative Negative Negative Negative Negative Negative  Urobilinogen, UA 0.2 0.2 0.2 0.2 0.2 0.2  Nitrite, UA neg neg neg Negative negative negative  Leukocytes, UA Negative Negative Negative Negative Negative Negative  Appearance    Clear light   Odor    None                  Component Ref Range & Units (hover) 5 d ago (02/11/24) 9 mo ago (05/06/23) 1 yr ago (02/10/23) 1 yr ago (02/02/23) 1 yr ago (02/02/23) 1 yr ago (02/02/23) 1 yr ago (07/01/22) 1 yr ago (07/01/22) 1 yr ago (07/01/22)  Glucose 87  84        Uric Acid 8.3  7.4 CM        Comment:            Therapeutic target for gout patients: <6.0  BUN 13  15        Creatinine, Ser 1.00  1.04        eGFR 100  96        BUN/Creatinine Ratio 13  14        Sodium 139  137        Potassium 4.3  4.3        Chloride 97  95 Low         Calcium 9.5  9.5        Phosphorus 3.1  3.2        Total Protein 7.9  7.7        Albumin 5.0  5.0        Globulin, Total 2.9  2.7        Bilirubin Total 0.7  0.5        Alkaline Phosphatase 89  81 R        LDH 219  191        AST 28  32        ALT 48 High   51 High         GGT 39  22        Iron 108  72        Cholesterol, Total 212 High   233 High         Triglycerides 152 High   130        HDL 42  46        VLDL Cholesterol Cal 27  24        LDL Chol Calc (NIH) 143 High   163 High         Chol/HDL Ratio 5.0  5.1 High  CM        Comment:                                   T. Chol/HDL Ratio  Men  Women                               1/2 Avg.Risk  3.4    3.3                                   Avg.Risk  5.0    4.4                                2X Avg.Risk  9.6    7.1                                3X Avg.Risk 23.4    11.0  Estimated CHD Risk 1.0  1.0 CM        Comment: The CHD Risk is based on the T. Chol/HDL ratio. Other factors affect CHD Risk such as hypertension, smoking, diabetes, severe obesity, and family history of premature CHD.  TSH 2.140  2.500        T4, Total 9.1  8.9        T3 Uptake Ratio 30  29        Free Thyroxine Index 2.7  2.6        Prostate Specific Ag, Serum 0.7     0.6 CM   0.6 CM  Comment: Roche ECLIA methodology. According to the American Urological Association, Serum PSA should decrease and remain at undetectable levels after radical prostatectomy. The AUA defines biochemical recurrence as an initial PSA value 0.2 ng/mL or greater followed by a subsequent confirmatory PSA value 0.2 ng/mL or greater. Values obtained with different assay methods or kits cannot be used interchangeably. Results cannot be interpreted as absolute evidence of the presence or absence of malignant disease.  WBC 6.7  5.3        RBC 5.91 High   6.13 High         Hemoglobin 17.3 16.7 17.8 High   17.2   16.8   Hematocrit 50.9 48.2 53.0 High  51.0   49.0    MCV 86  87        MCH 29.3  29.0        MCHC 34.0  33.6        RDW 12.6  12.7        Platelets 327  342        Neutrophils 60  57        Lymphs 30  29        Monocytes 8  11        Eos 1  2        Basos 0  1        Neutrophils Absolute 4.1  3.0        Lymphocytes Absolute 2.0  1.5        Monocytes Absolute 0.5  0.6        EOS (ABSOLUTE) 0.1  0.1        Basophils Absolute 0.0  0.0        Immature Granulocytes 1  0        Immature Grans (Abs) 0.1  0.0  _______________________________________    ____________________________________________   INITIAL IMPRESSION / ASSESSMENT AND PLAN  As part of my medical decision making, I reviewed the following data within the electronic MEDICAL RECORD NUMBER       No acute findings on physical exam or labs.  Advised to follow-up with [facility] for right lateral leg  pain.     ____________________________________________   FINAL CLINICAL IMPRESSION Mild   ED Discharge Orders          Ordered    Hearing screening        02/16/24 1350             Note:  This document was prepared using Dragon voice recognition software and may include unintentional dictation errors.     [1]  Social History Tobacco Use   Smoking status: Never    Passive exposure: Never   Smokeless tobacco: Never   "
